# Patient Record
Sex: Female | Born: 1985 | Race: Black or African American | Hispanic: No | Smoking: Never smoker
Health system: Southern US, Community
[De-identification: ages and names within clinical notes are randomized; demographics above are authoritative.]

## PROBLEM LIST (undated history)

## (undated) ENCOUNTER — Inpatient Hospital Stay (HOSPITAL_COMMUNITY): Payer: Self-pay

## (undated) DIAGNOSIS — R12 Heartburn: Secondary | ICD-10-CM

## (undated) DIAGNOSIS — N390 Urinary tract infection, site not specified: Secondary | ICD-10-CM

## (undated) DIAGNOSIS — O26899 Other specified pregnancy related conditions, unspecified trimester: Secondary | ICD-10-CM

## (undated) DIAGNOSIS — B009 Herpesviral infection, unspecified: Secondary | ICD-10-CM

## (undated) DIAGNOSIS — D649 Anemia, unspecified: Secondary | ICD-10-CM

## (undated) DIAGNOSIS — IMO0002 Reserved for concepts with insufficient information to code with codable children: Secondary | ICD-10-CM

## (undated) HISTORY — PX: WISDOM TOOTH EXTRACTION: SHX21

## (undated) HISTORY — PX: TONSILLECTOMY AND ADENOIDECTOMY: SHX28

## (undated) HISTORY — DX: Reserved for concepts with insufficient information to code with codable children: IMO0002

---
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
## 2003-12-31 ENCOUNTER — Ambulatory Visit: Payer: Self-pay | Admitting: Obstetrics and Gynecology

## 2004-09-26 ENCOUNTER — Emergency Department (HOSPITAL_COMMUNITY): Admission: EM | Admit: 2004-09-26 | Discharge: 2004-09-26 | Payer: Self-pay | Admitting: Family Medicine

## 2006-02-09 ENCOUNTER — Emergency Department (HOSPITAL_COMMUNITY): Admission: EM | Admit: 2006-02-09 | Discharge: 2006-02-09 | Payer: Self-pay | Admitting: Emergency Medicine

## 2006-05-29 ENCOUNTER — Emergency Department (HOSPITAL_COMMUNITY): Admission: EM | Admit: 2006-05-29 | Discharge: 2006-05-29 | Payer: Self-pay | Admitting: Emergency Medicine

## 2006-12-25 ENCOUNTER — Inpatient Hospital Stay (HOSPITAL_COMMUNITY): Admission: AD | Admit: 2006-12-25 | Discharge: 2006-12-25 | Payer: Self-pay | Admitting: Obstetrics & Gynecology

## 2008-09-03 ENCOUNTER — Emergency Department (HOSPITAL_COMMUNITY): Admission: EM | Admit: 2008-09-03 | Discharge: 2008-09-03 | Payer: Self-pay | Admitting: Emergency Medicine

## 2008-09-03 ENCOUNTER — Encounter (INDEPENDENT_AMBULATORY_CARE_PROVIDER_SITE_OTHER): Payer: Self-pay | Admitting: *Deleted

## 2009-07-10 ENCOUNTER — Emergency Department (HOSPITAL_COMMUNITY): Admission: EM | Admit: 2009-07-10 | Discharge: 2009-07-10 | Payer: Self-pay | Admitting: Emergency Medicine

## 2009-08-15 ENCOUNTER — Ambulatory Visit: Payer: Self-pay | Admitting: Nurse Practitioner

## 2009-08-15 ENCOUNTER — Inpatient Hospital Stay (HOSPITAL_COMMUNITY): Admission: AD | Admit: 2009-08-15 | Discharge: 2009-08-15 | Payer: Self-pay | Admitting: Family Medicine

## 2009-11-14 ENCOUNTER — Encounter (INDEPENDENT_AMBULATORY_CARE_PROVIDER_SITE_OTHER): Payer: Self-pay | Admitting: *Deleted

## 2009-11-18 ENCOUNTER — Ambulatory Visit: Payer: Self-pay | Admitting: Gastroenterology

## 2009-11-18 DIAGNOSIS — D509 Iron deficiency anemia, unspecified: Secondary | ICD-10-CM

## 2009-11-18 DIAGNOSIS — R1031 Right lower quadrant pain: Secondary | ICD-10-CM

## 2009-11-18 DIAGNOSIS — R197 Diarrhea, unspecified: Secondary | ICD-10-CM

## 2009-11-18 LAB — CONVERTED CEMR LAB
ALT: 12 units/L (ref 0–35)
AST: 16 units/L (ref 0–37)
Albumin: 3.6 g/dL (ref 3.5–5.2)
Alkaline Phosphatase: 58 units/L (ref 39–117)
BUN: 9 mg/dL (ref 6–23)
Basophils Absolute: 0 10*3/uL (ref 0.0–0.1)
Basophils Relative: 0.5 % (ref 0.0–3.0)
Bilirubin, Direct: 0 mg/dL (ref 0.0–0.3)
CO2: 26 meq/L (ref 19–32)
CRP, High Sensitivity: 10.96 — ABNORMAL HIGH (ref 0.00–5.00)
Calcium: 9 mg/dL (ref 8.4–10.5)
Chloride: 103 meq/L (ref 96–112)
Creatinine, Ser: 0.6 mg/dL (ref 0.4–1.2)
Eosinophils Absolute: 0.2 10*3/uL (ref 0.0–0.7)
Eosinophils Relative: 4.2 % (ref 0.0–5.0)
Ferritin: 7.7 ng/mL — ABNORMAL LOW (ref 10.0–291.0)
Folate: 7.2 ng/mL
GFR calc non Af Amer: 143.36 mL/min (ref >60)
Glucose, Bld: 87 mg/dL (ref 70–99)
HCT: 29.3 % — ABNORMAL LOW (ref 36.0–46.0)
Hemoglobin: 9.4 g/dL — ABNORMAL LOW (ref 12.0–15.0)
Iron: 20 ug/dL — ABNORMAL LOW (ref 42–145)
Lymphocytes Relative: 39.6 % (ref 12.0–46.0)
Lymphs Abs: 2 10*3/uL (ref 0.7–4.0)
MCHC: 32.2 g/dL (ref 30.0–36.0)
MCV: 75.3 fL — ABNORMAL LOW (ref 78.0–100.0)
Monocytes Absolute: 0.4 10*3/uL (ref 0.1–1.0)
Monocytes Relative: 7.8 % (ref 3.0–12.0)
Neutro Abs: 2.4 10*3/uL (ref 1.4–7.7)
Neutrophils Relative %: 47.9 % (ref 43.0–77.0)
Platelets: 334 10*3/uL (ref 150.0–400.0)
Potassium: 4.2 meq/L (ref 3.5–5.1)
RBC: 3.89 M/uL (ref 3.87–5.11)
RDW: 18 % — ABNORMAL HIGH (ref 11.5–14.6)
Saturation Ratios: 4.2 % — ABNORMAL LOW (ref 20.0–50.0)
Sed Rate: 35 mm/hr — ABNORMAL HIGH (ref 0–22)
Sodium: 136 meq/L (ref 135–145)
TSH: 1.47 microintl units/mL (ref 0.35–5.50)
Total Bilirubin: 0.2 mg/dL — ABNORMAL LOW (ref 0.3–1.2)
Total Protein: 7.2 g/dL (ref 6.0–8.3)
Transferrin: 338.8 mg/dL (ref 212.0–360.0)
Vitamin B-12: 664 pg/mL (ref 211–911)
WBC: 5.1 10*3/uL (ref 4.5–10.5)
hCG, Beta Chain, Quant, S: <0.5 milliintl units/mL

## 2009-12-02 ENCOUNTER — Encounter (INDEPENDENT_AMBULATORY_CARE_PROVIDER_SITE_OTHER): Payer: Self-pay | Admitting: *Deleted

## 2009-12-26 ENCOUNTER — Encounter (INDEPENDENT_AMBULATORY_CARE_PROVIDER_SITE_OTHER): Payer: Self-pay | Admitting: *Deleted

## 2009-12-29 ENCOUNTER — Ambulatory Visit: Payer: Self-pay | Admitting: Gastroenterology

## 2010-01-11 ENCOUNTER — Encounter: Payer: Self-pay | Admitting: Gastroenterology

## 2010-01-12 ENCOUNTER — Ambulatory Visit: Payer: Self-pay | Admitting: Gastroenterology

## 2010-01-13 ENCOUNTER — Encounter: Payer: Self-pay | Admitting: Gastroenterology

## 2010-02-24 NOTE — Miscellaneous (Signed)
Summary: prep  Clinical Lists Changes  Medications: Added new medication of TRILYTE 420 GM  SOLR (PEG 3350-KCL-NA BICARB-NACL) As per prep instructions. - Signed Rx of TRILYTE 420 GM  SOLR (PEG 3350-KCL-NA BICARB-NACL) As per prep instructions.;  #1 x 0;  Signed;  Entered by: Clide Cliff RN;  Authorized by: Mardella Layman MD [facility];  Method used: Electronically to [facility]. [phone]*, [address], [place], [place], [place]  [address], Ph: [phone], Fax: [phone]    Prescriptions: TRILYTE 420 GM  SOLR (PEG 3350-KCL-NA BICARB-NACL) As per prep instructions.  #1 x 0   Entered by:   Clide Cliff RN   Authorized by:   Mardella Layman MD [facility]   Signed by:   Clide Cliff RN on 12/29/2009   Method used:   Electronically to        [facility]. [phone]* (retail)       [address]       [place], [place]  [address]       Ph: [phone]       Fax: [phone]   RxID:   [id number]

## 2010-02-24 NOTE — Assessment & Plan Note (Signed)
Summary: abd pain...as.   History of Present Illness Visit Type: Initial Visit Primary GI MD: Sheryn Bison MD FACP FAGA Primary Provider: n/a Requesting Provider: n/a Chief Complaint: Pt c/o lower abdominal pain, has been to the ER for abdominal pain 3 months ago at [facility] History of Present Illness:   25 year old African American female referred for evaluation of 8 months of crampy right lower quadrant pain, diarrhea, intermittent rectal bleeding, and abnormal CT scan in August suggesting ileitis.  Miranda Tanner has been in excellent health without medical problems. She has had previous cesarean section. The last year she's had intermittent crampy lower abdominal pain, diarrhea, occasional rectal bleeding, has been seen in the emergency room twice and treated inferiorly with Cipro and Flagyl. She relates this has given her some periodic relief but her symptoms returned. CT Scan on August 10 showed thickening of the terminal ileum with adenopathy in the right lower quadrant. Exam otherwise was unremarkable. Her health care has been complicated by no insurance.  She denies associated skin rashes, joint pains, oral stomatitis, or visual difficulties. She has a regular periods with her last menstrual period allegedly on October 2. She denies a specific food intolerances, upper GI or hepatobiliary complaints or history of hepatitis or pancreatitis. 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 In the past she has had reactions to penicillin. Family history is noncontributory.   GI Review of Systems    Reports abdominal pain.     Location of  Abdominal pain: lower abdomen.    Denies acid reflux, belching, bloating, chest pain, dysphagia with liquids, dysphagia with solids, heartburn, loss of appetite, nausea, vomiting, vomiting blood, weight loss, and  weight gain.      Reports change in bowel habits,  hemorrhoids, and  rectal bleeding.     Denies anal fissure, black tarry stools, constipation, diarrhea, diverticulosis, fecal incontinence, heme positive stool, irritable bowel syndrome, jaundice, light color stool, liver problems, and  rectal pain. Preventive Screening-Counseling & Management  Alcohol-Tobacco     Smoking Status: quit      Drug Use:  no.      Current Medications (verified): 1)  None  Allergies (verified): 1)  ! Claritin 2)  ! Pcn  Past History:  Past medical, surgical, family and social histories (including risk factors) reviewed for relevance to current acute and chronic problems.  Past Medical History: Unremarkable  Past Surgical History: C-section 2004  Family History: Reviewed history and no changes required. Family History of Diabetes:  No FH of Colon Cancer:  Social History: Reviewed history and no changes required. single 1 child Patient is a former smoker.  Alcohol Use - yes  wine Illicit Drug Use - no Smoking Status:  quit Drug Use:  no  Review of Systems       The patient complains of menstrual pain.  The patient denies allergy/sinus, anemia, anxiety-new, arthritis/joint pain, back pain, blood in urine, breast changes/lumps, change in vision, confusion, cough, coughing up blood, depression-new, fainting, fatigue, fever, headaches-new, hearing problems, heart murmur, heart rhythm changes, itching, muscle pains/cramps, night sweats, nosebleeds, pregnancy symptoms, shortness of breath, skin rash, sleeping problems, sore throat, swelling of feet/legs, swollen lymph glands, thirst - excessive , urination - excessive , urination changes/pain, urine leakage, vision changes, and voice change.    Vital Signs:  Patient profile:   25 year old female Height:      60  inches Weight:      138 pounds BMI:     26.17 BSA:     1.61 Pulse rate:   80 / minute Pulse rhythm:   regular BP sitting:   110 / 80  (left arm)  Vitals Entered By: Merri Ray  CMA Duncan Dull) (November 18, 2009 10:13 AM)  Physical Exam  General:  Well developed, well nourished, no acute distress.healthy appearing.   Head:  Normocephalic and atraumatic. Eyes:  PERRLA, no icterus.exam deferred to patient's ophthalmologist.   Neck:  Supple; no masses or thyromegaly. Lungs:  Clear throughout to auscultation. Heart:  Regular rate and rhythm; no murmurs, rubs,  or bruits. Abdomen:  Soft, nontender and nondistended. No masses, hepatosplenomegaly or hernias noted. Normal bowel sounds.Tenderness to deep palpation in the right lower quadrant without any mass noted. Rectal:  Normal exam.hemocult negative.  No evidence of perirectal disease, fissures, or fistulae. Extremities:  No clubbing, cyanosis, edema or deformities noted. Neurologic:  Alert and  oriented x4;  grossly normal neurologically. Skin:  Intact without significant lesions or rashes. Cervical Nodes:  No significant cervical adenopathy. Psych:  Alert and cooperative. Normal mood and affect.   Impression & Recommendations:  Problem # 1:  RLQ PAIN (ICD-789.03) Assessment Unchanged Probable Crohn's ileitis perceptible pathology and abnormal CT scan. Labs have been ordered as has colonoscopy, and because of the lack of funds for medications and no samples of Entocort, I have started Pentasa 3 pills twice a day as tolerated. She had been advised to seek help from [facility] for her medical ongoing care and help with her medications expands..pregnancy test also ordered. Orders: TLB-CBC Platelet - w/Differential (85025-CBCD) TLB-BMP (Basic Metabolic Panel-BMET) (80048-METABOL) TLB-Hepatic/Liver Function Pnl (80076-HEPATIC) TLB-TSH (Thyroid Stimulating Hormone) (84443-TSH) TLB-B12, Serum-Total ONLY ([address]-U04) TLB-Ferritin (82728-FER) TLB-Folic Acid (Folate) (82746-FOL) TLB-IBC Pnl (Iron/FE;Transferrin) (83550-IBC) TLB-CRP-High Sensitivity (C-Reactive Protein) (86140-FCRP) TLB-Preg Serum Quant (B-hCG)  (84702-HCG-QN) TLB-Sedimentation Rate (ESR) (85652-ESR) Colonoscopy (Colon)  Problem # 2:  DIARRHEA (ICD-787.91) Assessment: Unchanged  Orders: TLB-CBC Platelet - w/Differential (85025-CBCD) TLB-BMP (Basic Metabolic Panel-BMET) (80048-METABOL) TLB-Hepatic/Liver Function Pnl (80076-HEPATIC) TLB-TSH (Thyroid Stimulating Hormone) (84443-TSH) TLB-B12, Serum-Total ONLY (54098-J19) TLB-Ferritin (82728-FER) TLB-Folic Acid (Folate) (82746-FOL) TLB-IBC Pnl (Iron/FE;Transferrin) (83550-IBC) TLB-CRP-High Sensitivity (C-Reactive Protein) (86140-FCRP) TLB-Preg Serum Quant (B-hCG) (84702-HCG-QN) TLB-Sedimentation Rate (ESR) (85652-ESR) Colonoscopy (Colon)  Patient Instructions: 1)  Take the samples of Pentasa as directed on your patient instructions. 2)  When you hear back about the patient assistance call back to schedule your colonoscopy. 3)  Please go to the basement today for your labs.  4)  The medication list was reviewed and reconciled.  All changed / newly prescribed medications were explained.  A complete medication list was provided to the patient / caregiver.

## 2010-02-24 NOTE — Letter (Signed)
Summary: New Patient letter  [facility]  [address] [place], [place] [address]   Phone: [phone]  Fax: [phone]       11/14/2009 MRN: [id number]  Miranda Tanner [address] [place], [place]  [address]  Dear Miranda Tanner,  Welcome to the Gastroenterology Division at [facility].    You are scheduled to see Dr. Jarold Motto on 11/18/2009 at 10:00AM on the 3rd floor at [facility], [address]  We ask that you try to arrive at our office 15 minutes prior to your appointment time to allow for check-in.  We would like you to complete the enclosed self-administered evaluation form prior to your visit and bring it with you on the day of your appointment.  We will review it with you.  Also, please bring a complete list of all your medications or, if you prefer, bring the medication bottles and we will list them.  Please bring your insurance card so that we may make a copy of it.  If your insurance requires a referral to see a specialist, please bring your referral form from your primary care physician.  Co-payments are due at the time of your visit and may be paid by cash, check or credit card.     Your office visit will consist of a consult with your physician (includes a physical exam), any laboratory testing he/she may order, scheduling of any necessary diagnostic testing (e.g. x-ray, ultrasound, CT-scan), and scheduling of a procedure (e.g. Endoscopy, Colonoscopy) if required.  Please allow enough time on your schedule to allow for any/all of these possibilities.    If you cannot keep your appointment, please call [phone] to cancel or reschedule prior to your appointment date.  This allows [name] the opportunity to schedule an appointment for another patient in need of care.  If you do not cancel or reschedule by 5 p.m. the business day prior to your appointment date, you will be charged a $50.00 late cancellation/no-show fee.    Thank you for choosing  [facility] for your medical needs.  We appreciate the opportunity to care for you.  Please visit [name] at our website  to learn more about our practice.                     Sincerely,                                                             The Gastroenterology Division

## 2010-02-24 NOTE — Letter (Signed)
Summary: [facility] Instructions  [facility]  [address] [place], [place] [address]   Phone: [phone]  Fax: [phone]       Miranda Tanner    25-Jun-1985    MRN: [id number]        Procedure Day Dorna Bloom:  Duanne Limerick  01/12/10     Arrival Time:  10:30AM     Procedure Time:  11:30AM     Location of Procedure:                    _X _  [facility] (4th Floor)                      PREPARATION FOR COLONOSCOPY WITH MOVIPREP   Starting 5 days prior to your procedure 01/07/10 do not eat nuts, seeds, popcorn, corn, beans, peas,  salads, or any raw vegetables.  Do not take any fiber supplements (e.g. Metamucil, Citrucel, and Benefiber).  THE DAY BEFORE YOUR PROCEDURE         DATE: 01/11/10  DAY: SUNDAY  1.  Drink clear liquids the entire day-NO SOLID FOOD  2.  Do not drink anything colored red or purple.  Avoid juices with pulp.  No orange juice.  3.  Drink at least 64 oz. (8 glasses) of fluid/clear liquids during the day to prevent dehydration and help the prep work efficiently.  CLEAR LIQUIDS INCLUDE: Water Jello Ice Popsicles Tea (sugar ok, no milk/cream) Powdered fruit flavored drinks Coffee (sugar ok, no milk/cream) Gatorade Juice: apple, white grape, white cranberry  Lemonade Clear bullion, consomm, broth Carbonated beverages (any kind) Strained chicken noodle soup Hard Candy                             4.  In the morning, mix first dose of MoviPrep solution:    Empty 1 Pouch A and 1 Pouch B into the disposable container    Add lukewarm drinking water to the top line of the container. Mix to dissolve    Refrigerate (mixed solution should be used within 24 hrs)  5.  Begin drinking the prep at 5:00 p.m. The MoviPrep container is divided by 4 marks.   Every 15 minutes drink the solution down to the next mark (approximately 8 oz) until the full liter is complete.   6.  Follow completed prep with 16 oz of clear liquid of your choice (Nothing  red or purple).  Continue to drink clear liquids until bedtime.  7.  Before going to bed, mix second dose of MoviPrep solution:    Empty 1 Pouch A and 1 Pouch B into the disposable container    Add lukewarm drinking water to the top line of the container. Mix to dissolve    Refrigerate  THE DAY OF YOUR PROCEDURE      DATE: 01/12/10  DAY: MONDAY  Beginning at 6:30AM (5 hours before procedure):         1. Every 15 minutes, drink the solution down to the next mark (approx 8 oz) until the full liter is complete.  2. Follow completed prep with 16 oz. of clear liquid of your choice.    3. You may drink clear liquids until 9:30AM (2 HOURS BEFORE PROCEDURE).   MEDICATION INSTRUCTIONS  Unless otherwise instructed, you should take regular prescription medications with a small sip of water   as early as possible the morning of your  procedure.  Diabetic patients - see separate instructions.  Stop taking Plavix or Aggrenox on  _  _  (7 days before procedure).     Stop taking Coumadin on  _ _  (5 days before procedure).  Additional medication instructions: _         OTHER INSTRUCTIONS  You will need a responsible adult at least 27 years of age to accompany you and drive you home.   This person must remain in the waiting room during your procedure.  Wear loose fitting clothing that is easily removed.  Leave jewelry and other valuables at home.  However, you may wish to bring a book to read or  an iPod/MP3 player to listen to music as you wait for your procedure to start.  Remove all body piercing jewelry and leave at home.  Total time from sign-in until discharge is approximately 2-3 hours.  You should go home directly after your procedure and rest.  You can resume normal activities the  day after your procedure.  The day of your procedure you should not:   Drive   Make legal decisions   Operate machinery   Drink alcohol   Return to work  You will receive specific  instructions about eating, activities and medications before you leave.  Patient was given tryilyte due to financial situation.  The above instructions have been reviewed and explained to me by   Clide Cliff, RN_____________________    I fully understand and can verbalize these instructions _____________________________ Date _________

## 2010-02-24 NOTE — Letter (Signed)
Summary: Pre Visit Letter Revised  [facility]  [address] [place], [place] [address]   Phone: [phone]  Fax: [phone]        12/02/2009 MRN: [id number] Miranda Tanner [address] [place], [place]  [address]             Procedure Date:  01-12-10   Welcome to the Gastroenterology Division at [facility].    You are scheduled to see a nurse for your pre-procedure visit on 12-29-09 at 11:00a.m. on the 3rd floor at [facility], [address].  We ask that you try to arrive at our office 15 minutes prior to your appointment time to allow for check-in.  Please take a minute to review the attached form.  If you answer "Yes" to one or more of the questions on the first page, we ask that you call the person listed at your earliest opportunity.  If you answer "No" to all of the questions, please complete the rest of the form and bring it to your appointment.    Your nurse visit will consist of discussing your medical and surgical history, your immediate family medical history, and your medications.   If you are unable to list all of your medications on the form, please bring the medication bottles to your appointment and we will list them.  We will need to be aware of both prescribed and over the counter drugs.  We will need to know exact dosage information as well.    Please be prepared to read and sign documents such as consent forms, a financial agreement, and acknowledgement forms.  If necessary, and with your consent, a friend or relative is welcome to sit-in on the nurse visit with you.  Please bring your insurance card so that we may make a copy of it.  If your insurance requires a referral to see a specialist, please bring your referral form from your primary care physician.  No co-pay is required for this nurse visit.     If you cannot keep your appointment, please call [phone] to cancel or reschedule prior to your appointment date.  This allows  [name] the opportunity to schedule an appointment for another patient in need of care.    Thank you for choosing [facility] for your medical needs.  We appreciate the opportunity to care for you.  Please visit [name] at our website  to learn more about our practice.  Sincerely, The Gastroenterology Division

## 2010-02-24 NOTE — Miscellaneous (Signed)
Summary: direct colon--ch.  Clinical Lists Changes  Allergies: Changed allergy or adverse reaction from CLARITIN to [facility] Removed allergy or adverse reaction of PCN Added new allergy or adverse reaction of PCN

## 2010-02-26 NOTE — Miscellaneous (Signed)
Summary: needs reglan called in for prep  Clinical Lists Changes  she was told to take reglan during her prep, but none was called in.    Medications: Added new medication of REGLAN 10 MG  TABS (METOCLOPRAMIDE HCL) take as directed - Signed Rx of REGLAN 10 MG  TABS (METOCLOPRAMIDE HCL) take as directed;  #2 x 0;  Signed;  Entered by: Rachael Fee MD;  Authorized by: Rachael Fee MD;  Method used: Electronically to [facility]. [phone]*, [address] [place], [place], [place]  [address], Ph: [phone], Fax: [phone]  s  Prescriptions: REGLAN 10 MG  TABS (METOCLOPRAMIDE HCL) take as directed  #2 x 0   Entered and Authorized by:   Rachael Fee MD   Signed by:   Rachael Fee MD on 01/11/2010   Method used:   Electronically to        [facility]. [phone]* (retail)       [address]       [place], [place]  [address]       Ph: [phone]       Fax: [phone]   RxID:   [id number]

## 2010-02-26 NOTE — Letter (Signed)
Summary: Patient Notice- Colon Biospy Results  [facility]  [address] [place], [place] [address]   Phone: [phone]  Fax: [phone]        January 13, 2010 MRN: [id number]    Miranda Tanner [address] [place], [place]  [address]    Dear Ms. SMITH,  I am pleased to inform you that the biopsies taken during your recent colonoscopy did not show any evidence of cancer upon pathologic examination.  Additional information/recommendations:  __No further action is needed at this time.  Please follow-up with      your primary care physician for your other healthcare needs.  __Please call [phone] to schedule a return visit to review      your condition.  _x_Continue with the treatment plan as outlined on the day of your      exam.  __You should have a repeat colonoscopy examination for this problem           in _ years.  Please call us if you are having persistent problems or have questions about your condition that have not been fully answered at this time.  Sincerely,  Mardella Layman MD [facility]   This letter has been electronically signed by your physician.  Appended Document: Patient Notice- Colon Biospy Results Letter mailed

## 2010-02-26 NOTE — Procedures (Signed)
Summary: Colonoscopy  Patient: [name] Note: All result statuses are Final unless otherwise noted.  Tests: (1) Colonoscopy (COL)   COL Colonoscopy           DONE     [facility]     [address]     [place], [place]  [address]           COLONOSCOPY PROCEDURE REPORT           PATIENT:  Miranda Tanner, Miranda Tanner  MR#:  [id number]     BIRTHDATE:  April 05, 1985, 24 yrs. old  GENDER:  female     ENDOSCOPIST:  Vania Rea. Jarold Motto, MD, [facility]     REF. BY:     PROCEDURE DATE:  01/12/2010     PROCEDURE:  Colonoscopy with biopsy     ASA CLASS:  Class II     INDICATIONS:  Abdominal pain ?? CROHN'S ILEITIS.     MEDICATIONS:   Fentanyl 50 mcg IV, Versed 8 mg IV           DESCRIPTION OF PROCEDURE:   After the risks benefits and     alternatives of the procedure were thoroughly explained, informed     consent was obtained.  Digital rectal exam was performed and     revealed no abnormalities.   The [id number] endoscope was     introduced through the anus and advanced to the terminal ileum     which was intubated for a short distance, limited by poor     preparation.    The quality of the prep was poor, using Nulytley.     The instrument was then slowly withdrawn as the colon was fully     examined.<<PROCEDUREIMAGES>>           FINDINGS:  The terminal ileum appeared normal. BIOPSIES DONE.  No     polyps or cancers were seen.  Otherwise normal colonoscopy without     other polyps, masses, vascular ectasias, or inflammatory changes.     Retroflexed views in the rectum revealed no abnormalities.    The     scope was then withdrawn from the patient and the procedure     completed.           COMPLICATIONS:  None     ENDOSCOPIC IMPRESSION:     1) Normal terminal ileum     2) No polyps or cancers     3) Otherwise nl colonoscopy WMO     RECOMMENDATIONS:     F/U PRN.     REPEAT EXAM:  No           ______________________________     Vania Rea. Jarold Motto, MD, Clementeen Graham           CC:           n.     eSIGNED:   Vania Rea. Patterson at 01/12/2010 11:43 AM           Trudi Ida, [id number]  Note: An exclamation mark (!) indicates a result that was not dispersed into the flowsheet. Document Creation Date: 01/12/2010 11:43 AM _______________________________________________________________________  (1) Order result status: Final Collection or observation date-time: 01/12/2010 11:37 Requested date-time:  Receipt date-time:  Reported date-time:  Referring Physician:   Ordering Physician: Sheryn Bison [phone]) Specimen Source:  Source: Launa Grill Order Number: [id number] Lab site:

## 2010-04-11 LAB — URINALYSIS, ROUTINE W REFLEX MICROSCOPIC
Bilirubin Urine: NEGATIVE
Glucose, UA: NEGATIVE mg/dL
Hgb urine dipstick: NEGATIVE
Ketones, ur: NEGATIVE mg/dL
pH: 7 (ref 5.0–8.0)

## 2010-04-12 LAB — URINALYSIS, ROUTINE W REFLEX MICROSCOPIC
Glucose, UA: NEGATIVE mg/dL
Hgb urine dipstick: NEGATIVE
Specific Gravity, Urine: 1.006 (ref 1.005–1.030)
Urobilinogen, UA: 0.2 mg/dL (ref 0.0–1.0)

## 2010-04-12 LAB — WET PREP, GENITAL
Clue Cells Wet Prep HPF POC: NONE SEEN
Trich, Wet Prep: NONE SEEN
Yeast Wet Prep HPF POC: NONE SEEN

## 2010-04-12 LAB — POCT PREGNANCY, URINE: Preg Test, Ur: NEGATIVE

## 2010-05-02 LAB — URINALYSIS, ROUTINE W REFLEX MICROSCOPIC
Glucose, UA: NEGATIVE mg/dL
Hgb urine dipstick: NEGATIVE
Specific Gravity, Urine: 1.03 (ref 1.005–1.030)

## 2010-05-02 LAB — COMPREHENSIVE METABOLIC PANEL
ALT: 18 U/L (ref 0–35)
AST: 28 U/L (ref 0–37)
Albumin: 3.4 g/dL — ABNORMAL LOW (ref 3.5–5.2)
Chloride: 105 mEq/L (ref 96–112)
Creatinine, Ser: 0.74 mg/dL (ref 0.4–1.2)
GFR calc Af Amer: >60 mL/min (ref >60)
Sodium: 137 mEq/L (ref 135–145)
Total Bilirubin: 0.8 mg/dL (ref 0.3–1.2)

## 2010-05-02 LAB — CBC
MCV: 84.9 fL (ref 78.0–100.0)
Platelets: 267 10*3/uL (ref 150–400)
WBC: 7.5 10*3/uL (ref 4.0–10.5)

## 2010-05-02 LAB — DIFFERENTIAL
Basophils Absolute: 0 10*3/uL (ref 0.0–0.1)
Eosinophils Relative: 0 % (ref 0–5)
Lymphocytes Relative: 11 % — ABNORMAL LOW (ref 12–46)
Lymphs Abs: 0.8 10*3/uL (ref 0.7–4.0)
Monocytes Absolute: 0.7 10*3/uL (ref 0.1–1.0)

## 2010-06-12 NOTE — Discharge Summary (Signed)
NAME:  Miranda Tanner, Miranda Tanner                          ACCOUNT NO.:  [id number]   MEDICAL RECORD NO.:  [id number]                   PATIENT TYPE:  INP   LOCATION:  9113                                 FACILITY:  [facility]   PHYSICIAN:  Roseanna Rainbow, M.D.         DATE OF BIRTH:  [birth year]   DATE OF ADMISSION:  01/26/2002  DATE OF DISCHARGE:  01/30/2002                                 DISCHARGE SUMMARY   CHIEF COMPLAINT:  The patient is a 25 year old gravida 1 with an estimated  date of confinement of January 23, 2002, who presents complaining of  contractions.   PAST OBSTETRICAL AND GYNECOLOGICAL HISTORY:  As above.   PAST MEDICAL HISTORY:  She denies.   PAST SURGICAL HISTORY:  She denies.   SOCIAL HISTORY:  She denies any tobacco, ethanol, or substance abuse.   PHYSICAL EXAMINATION:  VITAL SIGNS:  Blood pressure 140/90.  HEENT:  Normocephalic and atraumatic.  LUNGS:  Clear to auscultation bilaterally.  HEART:  Regular rate and rhythm without any murmurs, rubs, or gallops.  ABDOMEN:  The estimated fetal weight was 5 pounds 12 ounces.  PELVIC:  The cervix was 2 cm and 70% effaced with the vertex at a -2 to -3  station.  EXTREMITIES:  Nontender.   ASSESSMENT:  Primigravida at term in early labor with borderline elevated  blood pressures.   PLAN:  Admission.  Would check PIH labs.   HOSPITAL COURSE:  The patient was subsequently admitted.  She was augmented  with Pitocin.  She progressed to 4 cm dilatation and 60% effaced.  The  cervix was felt to be edematous.  The vertex was at a -3 station.  [place] units were approximately 200.  She became febrile with a  temperature maximum of 100.8 degrees.  The fetus became slightly tachycardic  with the baseline in the 160 silk.  She was brought for a cesarean delivery.  Please see the dictated operative summary for further details.  The  postoperative course was remarkable for anemia with a hemoglobin of 8.2 and  this remained  stable on postoperative day #2 with a hemoglobin of 7.9.   DISPOSITION:  She was discharged to home on postoperative day #3, tolerating  a regular diet.   DISCHARGE DIAGNOSES:  1. Term intrauterine pregnancy.  2. Mild pregnancy-induced hypertension.  3. Protracted latent phase with chorioamnionitis.   PROCEDURES:  Primary cesarean delivery.   CONDITION ON DISCHARGE:  Stable.   DIET:  Regular.   ACTIVITY:  Per instruction booklet.   DISCHARGE MEDICATIONS:  1. Percocet one to two tablets every four to six hours as needed.  2. Ibuprofen one tablet every six hours as needed.  3. Ferrous sulfate one tablet t.i.d.  4. Colace as needed.  5. Ortho Tri-Cyclen Low one tablet p.o. daily.  Start second Sunday.  6. Prenatal vitamins one tablet p.o. daily.   FOLLOW-UP:  The patient was to follow up in  the office in one week.                                               Roseanna Rainbow, M.D.    Judee Clara  D:  03/08/2002  T:  03/08/2002  Job:  [id number]

## 2010-06-12 NOTE — Op Note (Signed)
NAME:  Miranda Tanner, Miranda Tanner                          ACCOUNT NO.:  [id number]   MEDICAL RECORD NO.:  [id number]                   PATIENT TYPE:  INP   LOCATION:  9113                                 FACILITY:  [facility]   PHYSICIAN:  Roseanna Rainbow, M.D.         DATE OF BIRTH:  1985-11-19   DATE OF PROCEDURE:  01/27/2002  DATE OF DISCHARGE:  01/30/2002                                 OPERATIVE REPORT   PREOPERATIVE DIAGNOSES:  1. Intrauterine pregnancy at term.  2. Mild pregnancy induced hypertension.  3. Protracted latent phase.  4. Chorioamnionitis.   POSTOPERATIVE DIAGNOSES:  1. Intrauterine pregnancy at term.  2. Mild pregnancy induced hypertension.  3. Protracted latent phase.  4. Chorioamnionitis.   PROCEDURE:  Primary low uterine flap elliptical cesarean delivery via  Pfannenstiel skin incision.   SURGEON:  Roseanna Rainbow, M.D.   ANESTHESIA:  Epidural.   ESTIMATED BLOOD LOSS:  800 mL.   COMPLICATIONS:  None.   INDICATIONS:  The patient is a 25 year old gravida 1 who presented in early  labor.  Maximum dilatation 3 cm, 80% effaced, with the vertex at a -3.  She  developed chorioamnionitis during labor.   PROCEDURE:  The patient was taken to the operating room with an epidural  anesthetic.  The epidural anesthetic was found to be adequate.  She was  prepped and draped in the usual sterile fashion.  A Pfannenstiel skin  incision was then made with a scalpel and carried down to the fascia with  the Bovie.  The fascia was nicked in the midline and the fascial incision  was extended bilaterally with the curved Mayo scissors.  The superior aspect  of the fascial incision was then grasped with Kocher clamps, tented up, then  the underlying rectus muscles dissected off.  The inferior aspect of the  fascial incision was manipulated in a similar fashion.  The rectus muscles  were separated in the midline.  The parietoperitoneum was tented up and  entered sharply.  This  incision was extended superiorly and inferiorly with  good visualization of the bladder.  The bladder blade was then placed.  The  vesicouterine peritoneum was tented up and entered sharply.  This incision  was then extended bilaterally and the bladder flap created digitally.  The  lower uterine segment was then incised in a transverse fashion and the  incision extended bluntly.  A live boy female vertex LOP was delivered  atraumatically.  The infant was suctioned with bulb suction.  The cord was  clamped and cut and the infant was handed to the awaiting neonatologists.  Apgars were 8 at one minute and 9 at five minutes, respectively.  The  placenta was delivered.  The uterus was evacuated of amniotic fluid, clots,  and debris with a moistened laparotomy sponge.  The uterine incision was  repaired in a running interlocking fashion using 0 Monocryl.  A second layer  was  used to imbricate the middle aspect of the incision.  Excellent  hemostasis was noted.  The pericolic gutters were then copiously irrigated.  The fascia was  reapproximated with 0 Vicryl.  The skin was reapproximated with staples.  At  the close of the procedure the instrument and pack counts were said to be  correct x2.  The patient was taken to the PACU awake and in stable  condition.                                               Roseanna Rainbow, M.D.    Judee Clara  D:  03/08/2002  T:  03/08/2002  Job:  [id number]

## 2010-11-03 LAB — CBC
HCT: 34.1 — ABNORMAL LOW
MCV: 86.9
Platelets: 300
RDW: 12.6
WBC: 8.1

## 2010-11-03 LAB — URINE MICROSCOPIC-ADD ON

## 2010-11-03 LAB — URINE CULTURE: Culture: NO GROWTH

## 2010-11-03 LAB — DIFFERENTIAL
Eosinophils Absolute: 0 — ABNORMAL LOW
Eosinophils Relative: 0
Lymphs Abs: 1.2
Monocytes Absolute: 0.8

## 2010-11-03 LAB — URINALYSIS, ROUTINE W REFLEX MICROSCOPIC
Glucose, UA: NEGATIVE
Leukocytes, UA: NEGATIVE
pH: 6

## 2010-11-03 LAB — WET PREP, GENITAL
Clue Cells Wet Prep HPF POC: NONE SEEN
Trich, Wet Prep: NONE SEEN

## 2010-12-31 ENCOUNTER — Inpatient Hospital Stay (HOSPITAL_COMMUNITY)
Admission: AD | Admit: 2010-12-31 | Discharge: 2010-12-31 | Disposition: A | Discharge status: Home / Self Care | Payer: Self-pay | Source: Ambulatory Visit | Attending: Obstetrics & Gynecology | Admitting: Obstetrics & Gynecology

## 2010-12-31 ENCOUNTER — Encounter (HOSPITAL_COMMUNITY): Payer: Self-pay

## 2010-12-31 DIAGNOSIS — R109 Unspecified abdominal pain: Secondary | ICD-10-CM | POA: Insufficient documentation

## 2010-12-31 DIAGNOSIS — N390 Urinary tract infection, site not specified: Secondary | ICD-10-CM

## 2010-12-31 DIAGNOSIS — N3000 Acute cystitis without hematuria: Secondary | ICD-10-CM | POA: Insufficient documentation

## 2010-12-31 HISTORY — DX: Urinary tract infection, site not specified: N39.0

## 2010-12-31 HISTORY — DX: Anemia, unspecified: D64.9

## 2010-12-31 LAB — URINALYSIS, ROUTINE W REFLEX MICROSCOPIC
Bilirubin Urine: NEGATIVE
Glucose, UA: 100 mg/dL — AB
Hgb urine dipstick: NEGATIVE
Ketones, ur: NEGATIVE mg/dL
Leukocytes, UA: NEGATIVE
Protein, ur: 30 mg/dL — AB

## 2010-12-31 LAB — URINE MICROSCOPIC-ADD ON

## 2010-12-31 MED ORDER — PHENAZOPYRIDINE HCL 100 MG PO TABS: 100.0000 mg | ORAL_TABLET | Freq: Three times a day (TID) | ORAL | Status: AC | PRN

## 2010-12-31 MED ORDER — NITROFURANTOIN MONOHYD MACRO 100 MG PO CAPS: 100.0000 mg | ORAL_CAPSULE | Freq: Two times a day (BID) | ORAL | Status: AC

## 2010-12-31 NOTE — ED Provider Notes (Signed)
History     Chief Complaint  Patient presents with  . Abdominal Pain   HPIBrandi TIANAH Tanner is 25 y.o.  presenting with urinary frequency and lower abdominal pain for 4 days.  Took over the counter AZO without relief of sxs.      No past medical history on file.  No past surgical history on file.  No family history on file.  History  Substance Use Topics  . Smoking status: Not on file  . Smokeless tobacco: Not on file  . Alcohol Use: Not on file    Allergies:  Allergies  Allergen Reactions  . Loratadine     REACTION: hives  . Penicillins     REACTION: tongue swelled    No prescriptions prior to admission    Review of Systems  Constitutional: Negative for fever and chills.  Gastrointestinal: Negative for nausea and vomiting.  Genitourinary: Positive for urgency and frequency. Negative for hematuria.   Physical Exam   Blood pressure 129/75, pulse 82, temperature 98.8 F (37.1 C), temperature source Oral, resp. rate 16, height 5\' 2"  (1.575 m), weight 144 lb 12.8 oz (65.681 kg), last menstrual period 12/11/2010, SpO2 97.00%.  Physical Exam  Constitutional: She is oriented to person, place, and time. She appears well-developed and well-nourished. No distress.  HENT:  Head: Normocephalic.  Neck: Normal range of motion.  Respiratory: Effort normal.  Musculoskeletal:       Neg for CVA tenderness bilaterally  Neurological: She is alert and oriented to person, place, and time.  Skin: Skin is warm and dry.    MAU Course  Procedures  MDM   Assessment and Plan  A; Acute cystitis  P: Rx for Macrobid with instruction given to patient.     Increase po fluids.  KEY,EVE M 12/31/2010, 5:28 PM   Matt Holmes, NP 12/31/10 1813

## 2010-12-31 NOTE — Progress Notes (Signed)
Patient states she has had lower abdominal pain and urinary frequency for 4 days. Tried OTC Azo but did not help.

## 2011-01-05 NOTE — ED Provider Notes (Signed)
Agree with note. 

## 2011-05-23 ENCOUNTER — Encounter (HOSPITAL_COMMUNITY): Payer: Self-pay | Admitting: *Deleted

## 2011-05-23 ENCOUNTER — Inpatient Hospital Stay (HOSPITAL_COMMUNITY): Payer: Medicaid Other

## 2011-05-23 ENCOUNTER — Inpatient Hospital Stay (HOSPITAL_COMMUNITY)
Admission: AD | Admit: 2011-05-23 | Discharge: 2011-05-23 | Disposition: A | Discharge status: Home / Self Care | Payer: Medicaid Other | Source: Ambulatory Visit | Attending: Obstetrics & Gynecology | Admitting: Obstetrics & Gynecology

## 2011-05-23 DIAGNOSIS — D509 Iron deficiency anemia, unspecified: Secondary | ICD-10-CM

## 2011-05-23 DIAGNOSIS — D649 Anemia, unspecified: Secondary | ICD-10-CM | POA: Insufficient documentation

## 2011-05-23 DIAGNOSIS — Z331 Pregnant state, incidental: Secondary | ICD-10-CM

## 2011-05-23 DIAGNOSIS — O99019 Anemia complicating pregnancy, unspecified trimester: Secondary | ICD-10-CM | POA: Insufficient documentation

## 2011-05-23 LAB — POCT PREGNANCY, URINE: Preg Test, Ur: POSITIVE — AB

## 2011-05-23 LAB — CBC
HCT: 32.5 % — ABNORMAL LOW (ref 36.0–46.0)
Hemoglobin: 10.8 g/dL — ABNORMAL LOW (ref 12.0–15.0)
MCV: 82.1 fL (ref 78.0–100.0)
RBC: 3.96 MIL/uL (ref 3.87–5.11)
WBC: 5.6 10*3/uL (ref 4.0–10.5)

## 2011-05-23 LAB — DIFFERENTIAL
Eosinophils Relative: 4 % (ref 0–5)
Lymphocytes Relative: 35 % (ref 12–46)
Lymphs Abs: 1.9 10*3/uL (ref 0.7–4.0)
Monocytes Absolute: 0.5 10*3/uL (ref 0.1–1.0)
Monocytes Relative: 8 % (ref 3–12)

## 2011-05-23 LAB — URINALYSIS, ROUTINE W REFLEX MICROSCOPIC
Leukocytes, UA: NEGATIVE
Nitrite: NEGATIVE
Specific Gravity, Urine: 1.02 (ref 1.005–1.030)
pH: 6 (ref 5.0–8.0)

## 2011-05-23 LAB — HCG, QUANTITATIVE, PREGNANCY: hCG, Beta Chain, Quant, S: 15105 m[IU]/mL — ABNORMAL HIGH (ref <5)

## 2011-05-23 MED ORDER — PRENATAL VITAMINS (DIS) PO TABS: 1.0000 | ORAL_TABLET | Freq: Every day | ORAL | Status: DC

## 2011-05-23 NOTE — MAU Note (Signed)
H. Neese, NP at bedside.  Assessment done and poc discussed with pt.  

## 2011-05-23 NOTE — Discharge Instructions (Signed)
________________________________________     To schedule your Maternity Eligibility Appointment, please call [phone].  When you arrive for your appointment you must bring the following items or information listed below.  Your appointment will be rescheduled if you do not have these items or are 15 minutes late. If currently receiving Medicaid, you MUST bring: 1. Medicaid Card 2. Social Security Card 3. Picture ID 4. Proof of Pregnancy 5. Verification of current address if the address on Medicaid card is incorrect "postmarked mail" If not receiving Medicaid, you MUST bring: 1. Social Security Card 2. Picture ID 3. Birth Certificate (if available) Passport or *Green Card 4. Proof of Pregnancy 5. Verification of current address "postmarked mail" for each income presented. 6. Verification of insurance coverage, if any 7. Check stubs from each employer for the previous month (if unable to present check stub  for each week, we will accept check stub for the first and last week ill the same month.) If you can't locate check stubs, you must bring a letter from the employer(s) and it must have the following information on letterhead, typed, in English: o name of company o company telephone number o how long been with the company, if less than one month o how much person earns per hour o how many hours per week work o the gross pay the person earned for the previous month If you are 28 years old or less, you do not have to bring proof of income unless you work or live with the father of the baby and at that time we will need proof of income from you and/or the father of the baby. Green Card recipients are eligible for Medicaid for Pregnant Women (MPW)     Folic Acid in Pregnancy Folic acid is a B vitamin that helps prevent neural tube defects (NTDs). The neural tube is the part of a developing baby that becomes the brain and spinal cord. When the neural tube does not close properly,  a baby is born with an NTD. NTDs include spina bifida, hernia of the spinal cord, and the absence of part of, or all of the brain (anencephaly).  Take folic acid at least 4 weeks before getting pregnant and through the first 3 months of pregnancy. This is when the neural tube is developing. It is available in most multivitamins, as a folic acid-only supplement, and in some foods. Taking the right amount of folic acid before conception and during pregnancy lessens the chances of having a baby born with an NTD. Giving folic acid will not affect a neural tube defect if it is already present. DIAGNOSIS   An Alpha-Fetoprotein (AFP) blood or amniotic fluid test will show high levels of the alpha-feto protein if a woman is carrying a baby with an NTD. This test is done on all pregnant women in the first trimester.   An ultrasound may detect an NTD.  WHAT YOU CAN DO:  Take a multivitamin with at least 0.4 milligrams (400 micrograms) of folic acid daily at least 4 weeks before getting pregnant and through the first 12 weeks of pregnancy.   If you have already had a pregnancy affected by an NTD, take 4 milligrams (4,000 micrograms) of folic acid daily. Take this amount 1 month before you start trying to get pregnant and continue through the first 3 months of pregnancy.   Talk to your caregiver if you are taking medicines for seizures. Your caregiver will be able to manage your seizure medicines  and your pregnancy in the best way.  FOLIC ACID IN FOODS Eat a healthy diet that has foods that contain folic acid, the natural form of the vitamin. Such foods include:  Fortified breakfast cereals.   Lentils.   Asparagus.   Spinach.   Organ meats (liver).   Black beans.   Peanuts (eat only if you do not have a peanut allergy).   Broccoli.   Strawberries, oranges.   Orange juice (from concentrate is best).   Enriched breads and pasta.   Romaine lettuce.  TALK TO YOUR CAREGIVER IF:  You are in  your first trimester and have high blood sugar.   You are in your first trimester and have an oral temperature above 102 F (38.9 C).   You are in your first trimester and had sauna treatments.   You are pregnant (or want to become pregnant) and take a prescription medicine called valproic acid.  In almost all cases, a fetus found to have an NTD will need specialized care that may not be available in all hospitals. Talk to your caregiver about what is best for you and your baby. Document Released: 01/14/2003 Document Revised: 12/31/2010 Document Reviewed: 04/16/2009 [facility] Patient Information 2012 [place], [place].Iron Deficiency Anemia  Anemia is when you have a low number of healthy red blood cells. HOME CARE   Ask your doctor or dietician what foods you should eat.   Take iron and vitamins as told by your doctor.   Eat foods that have iron in them. This includes liver, lean beef, whole-grain bread, eggs, dried fruit, and dark green leafy vegetables.  GET HELP RIGHT AWAY IF:  You pass out (faint).   You have chest pain.   You feel sick to your stomach (nauseous) or throw up (vomit).   You get very short of breath with activity.   You are weak.   You are thirstier than normal.   You have a fast heartbeat.   You start to sweat or become lightheaded when getting up from a chair or bed.  MAKE SURE YOU:  Understand these instructions.   Will watch your condition.   Will get help right away if you are not doing well or get worse.  Document Released: 02/13/2010 Document Revised: 12/31/2010 Document Reviewed: 02/13/2010 [facility] Patient Information 2012 [place], [place].Pregnancy, First Trimester The first trimester is the first 3 months your baby is growing inside you. It is important to follow your doctor's instructions. HOME CARE   Do not smoke.   Do not drink alcohol.   Only take medicine as told by your doctor.   Exercise.   Eat healthy foods. Eat regular,  well-balanced meals.   You can have sex (intercourse) if there are no other problems with the pregnancy.   Things that help with morning sickness:   Eat soda crackers before getting up in the morning.   Eat 4 to 5 small meals rather than 3 large meals.   Drink liquids between meals, not during meals.   Go to all appointments as told.   Take all vitamins or supplements as told by your doctor.  GET HELP RIGHT AWAY IF:   You develop a fever.   You have a bad smelling fluid that is leaking from your vagina.   There is bleeding from the vagina.   You develop severe belly (abdominal) or back pain.   You throw up (vomit) blood. It may look like coffee grounds.   You lose more than 2 pounds  in a week.   You gain 5 pounds or more in a week.   You gain more than 2 pounds in a week and you see puffiness (swelling) in your feet, ankles, or legs.   You have severe dizziness or pass out (faint).   You are around people who have [place] measles, chickenpox, or fifth disease.   You have a headache, watery poop (diarrhea), pain with peeing (urinating), or cannot breath right.  Document Released: 06/30/2007 Document Revised: 12/31/2010 Document Reviewed: 06/30/2007 [facility] Patient Information 2012 [place], [place].Pregnancy, First Trimester The first trimester is the first 3 months your baby is growing inside you. It is important to follow your doctor's instructions. HOME CARE   Do not smoke.   Do not drink alcohol.   Only take medicine as told by your doctor.   Exercise.   Eat healthy foods. Eat regular, well-balanced meals.   You can have sex (intercourse) if there are no other problems with the pregnancy.   Things that help with morning sickness:   Eat soda crackers before getting up in the morning.   Eat 4 to 5 small meals rather than 3 large meals.   Drink liquids between meals, not during meals.   Go to all appointments as told.   Take all vitamins or supplements  as told by your doctor.  GET HELP RIGHT AWAY IF:   You develop a fever.   You have a bad smelling fluid that is leaking from your vagina.   There is bleeding from the vagina.   You develop severe belly (abdominal) or back pain.   You throw up (vomit) blood. It may look like coffee grounds.   You lose more than 2 pounds in a week.   You gain 5 pounds or more in a week.   You gain more than 2 pounds in a week and you see puffiness (swelling) in your feet, ankles, or legs.   You have severe dizziness or pass out (faint).   You are around people who have [place] measles, chickenpox, or fifth disease.   You have a headache, watery poop (diarrhea), pain with peeing (urinating), or cannot breath right.  Document Released: 06/30/2007 Document Revised: 12/31/2010 Document Reviewed: 06/30/2007 [facility] Patient Information 2012 [place], [place].

## 2011-05-23 NOTE — MAU Note (Signed)
Pt had a home +UPT-states she is anemic and feels light headed and thnks she is pregnant

## 2011-05-23 NOTE — MAU Provider Note (Signed)
History     CSN: [id number]  Arrival date & time 05/23/11  1934   None     Chief Complaint  Patient presents with  . Possible Pregnancy    HPI Miranda Tanner is a 26 y.o. female  who presents to MAU for feeling tired and dizzy.History of anemia. Positive home pregnancy test. LMP 04/15/11, 5.[redacted] weeks gestation. She also complains lower abdominal cramping that comes and goes. She rates her pain as 3/10. Last pap smear last month at [facility] and was normal. Current sex partner x 7 years. No history of STI's.   Past Medical History  Diagnosis Date  . Urinary tract infection   . Anemia     Past Surgical History  Procedure Date  . Cesarean section   . Tonsillectomy and adenoidectomy   . Wisdom tooth extraction     Family History  Problem Relation Age of Onset  . Diabetes Maternal Grandmother   . Diabetes Maternal Grandfather     History  Substance Use Topics  . Smoking status: Never Smoker   . Smokeless tobacco: Not on file  . Alcohol Use: Yes     social     OB History    Grav Para Term Preterm Abortions TAB SAB Ect Mult Living   2 1 1       1       Review of Systems  Constitutional: Positive for fatigue. Negative for fever, chills and diaphoresis.  HENT: Positive for congestion and sneezing. Negative for ear pain, sore throat, facial swelling, neck pain, neck stiffness, dental problem and sinus pressure.   Eyes: Negative for photophobia, pain and discharge.  Respiratory: Negative for cough, chest tightness and wheezing.   Gastrointestinal: Positive for abdominal pain (cramping). Negative for nausea, vomiting, diarrhea, constipation and abdominal distention.  Genitourinary: Positive for frequency. Negative for dysuria, urgency, flank pain, vaginal bleeding, vaginal discharge and difficulty urinating.  Musculoskeletal: Positive for back pain. Negative for myalgias and gait problem.  Skin: Negative for color change and rash.  Neurological: Positive for  dizziness and headaches. Negative for speech difficulty, weakness, light-headedness and numbness.  Psychiatric/Behavioral: Negative for confusion and agitation. The patient is not nervous/anxious.     Allergies  Loratadine and Penicillins  Home Medications  No current outpatient prescriptions on file.  BP 135/79  Pulse 97  Temp(Src) 99.1 F (37.3 C) (Oral)  Resp 20  Ht 5\' 1"  (1.549 m)  Wt 145 lb (65.772 kg)  BMI 27.40 kg/m2  SpO2 100%  LMP 04/15/2011  Physical Exam  Nursing note and vitals reviewed. Constitutional: She is oriented to person, place, and time. She appears well-developed and well-nourished. No distress.  HENT:  Head: Normocephalic.  Eyes: EOM are normal.  Neck: Neck supple.  Cardiovascular: Normal rate.   Pulmonary/Chest: Effort normal.  Abdominal: Soft. There is no tenderness.  Genitourinary:       External genitalia without lesions. White discharge vaginal vault. Cervix closed, long, no CMT, no adnexal tenderness, uterus slightly enlarged.  Musculoskeletal: Normal range of motion.  Neurological: She is alert and oriented to person, place, and time. No cranial nerve deficit.  Skin: Skin is warm and dry.  Psychiatric: She has a normal mood and affect. Her behavior is normal. Judgment and thought content normal.   Results for orders placed during the hospital encounter of 05/23/11 (from the past 24 hour(s))  URINALYSIS, ROUTINE W REFLEX MICROSCOPIC     Status: Normal   Collection Time   05/23/11  7:48  PM      Component Value Range   Color, Urine YELLOW  YELLOW    APPearance CLEAR  CLEAR    Specific Gravity, Urine 1.020  1.005 - 1.030    pH 6.0  5.0 - 8.0    Glucose, UA NEGATIVE  NEGATIVE (mg/dL)   Hgb urine dipstick NEGATIVE  NEGATIVE    Bilirubin Urine NEGATIVE  NEGATIVE    Ketones, ur NEGATIVE  NEGATIVE (mg/dL)   Protein, ur NEGATIVE  NEGATIVE (mg/dL)   Urobilinogen, UA 0.2  0.0 - 1.0 (mg/dL)   Nitrite NEGATIVE  NEGATIVE    Leukocytes, UA NEGATIVE   NEGATIVE   POCT PREGNANCY, URINE     Status: Abnormal   Collection Time   05/23/11  7:56 PM      Component Value Range   Preg Test, Ur POSITIVE (*) NEGATIVE   ABO/RH     Status: Normal (Preliminary result)   Collection Time   05/23/11  8:35 PM      Component Value Range   ABO/RH(D) O NEG    CBC     Status: Abnormal   Collection Time   05/23/11  8:42 PM      Component Value Range   WBC 5.6  4.0 - 10.5 (K/uL)   RBC 3.96  3.87 - 5.11 (MIL/uL)   Hemoglobin 10.8 (*) 12.0 - 15.0 (g/dL)   HCT 16.1 (*) 09.6 - 46.0 (%)   MCV 82.1  78.0 - 100.0 (fL)   MCH 27.3  26.0 - 34.0 (pg)   MCHC 33.2  30.0 - 36.0 (g/dL)   RDW 04.5 (*) 40.9 - 15.5 (%)   Platelets 270  150 - 400 (K/uL)  DIFFERENTIAL     Status: Normal   Collection Time   05/23/11  8:42 PM      Component Value Range   Neutrophils Relative 53  43 - 77 (%)   Neutro Abs 3.0  1.7 - 7.7 (K/uL)   Lymphocytes Relative 35  12 - 46 (%)   Lymphs Abs 1.9  0.7 - 4.0 (K/uL)   Monocytes Relative 8  3 - 12 (%)   Monocytes Absolute 0.5  0.1 - 1.0 (K/uL)   Eosinophils Relative 4  0 - 5 (%)   Eosinophils Absolute 0.2  0.0 - 0.7 (K/uL)   Basophils Relative 1  0 - 1 (%)   Basophils Absolute 0.0  0.0 - 0.1 (K/uL)  HCG, QUANTITATIVE, PREGNANCY     Status: Abnormal   Collection Time   05/23/11  8:42 PM      Component Value Range   hCG, Beta Chain, Quant, S 15105 (*) <5 (mIU/mL)  WET PREP, GENITAL     Status: Abnormal   Collection Time   05/23/11  9:08 PM      Component Value Range   Yeast Wet Prep HPF POC NONE SEEN  NONE SEEN    Trich, Wet Prep NONE SEEN  NONE SEEN    Clue Cells Wet Prep HPF POC NONE SEEN  NONE SEEN    WBC, Wet Prep HPF POC MODERATE (*) NONE SEEN    US Ob Comp Less 14 Wks  05/23/2011  *RADIOLOGY REPORT*  Clinical Data: Anemia, lightheaded, pregnant. Pain.  OBSTETRIC <14 WK [name] AND TRANSVAGINAL OB US  Technique:  Both transabdominal and transvaginal ultrasound examinations were performed for complete evaluation of the gestation as  well as the maternal uterus, adnexal regions, and pelvic cul-de-sac.  Transvaginal technique was performed to assess early pregnancy.  Comparison:  None.  Intrauterine gestational sac:  Visualized/normal in shape. Yolk sac: Identified Embryo: Not identified Cardiac Activity: Not applicable  MSD: 12.3  mm  6    w 0    d [name] EDC: 01/16/2012  Maternal uterus/adnexae: No free fluid.  Normal sonographic appearance to the ovaries. Corpus luteal cyst noted on the left.  IMPRESSION: Single intrauterine gestational sac identified with estimated age of 6 weeks 0 days by mean sac diameter.  Original Report Authenticated By: Waneta Martins, M.D.   US Ob Transvaginal  05/23/2011  *RADIOLOGY REPORT*  Clinical Data: Anemia, lightheaded, pregnant. Pain.  OBSTETRIC <14 WK [name] AND TRANSVAGINAL OB US  Technique:  Both transabdominal and transvaginal ultrasound examinations were performed for complete evaluation of the gestation as well as the maternal uterus, adnexal regions, and pelvic cul-de-sac.  Transvaginal technique was performed to assess early pregnancy.  Comparison:  None.  Intrauterine gestational sac:  Visualized/normal in shape. Yolk sac: Identified Embryo: Not identified Cardiac Activity: Not applicable  MSD: 12.3  mm  6    w 0    d [name] EDC: 01/16/2012  Maternal uterus/adnexae: No free fluid.  Normal sonographic appearance to the ovaries. Corpus luteal cyst noted on the left.  IMPRESSION: Single intrauterine gestational sac identified with estimated age of 6 weeks 0 days by mean sac diameter.  Original Report Authenticated By: Waneta Martins, M.D.   Assessment: Six week IUP   Mild anemia  Plan:  Rx prenatal vitamins   Start prenatal care   Return as needed.   Discussed with patient eating small meals more frequently, getting up and down slowly, drinking plenty of fluids. ED Course  Procedures  MDM

## 2011-05-24 LAB — GC/CHLAMYDIA PROBE AMP, GENITAL: GC Probe Amp, Genital: NEGATIVE

## 2011-05-27 ENCOUNTER — Telehealth: Payer: Self-pay | Admitting: Obstetrics and Gynecology

## 2011-05-27 NOTE — Telephone Encounter (Signed)
Routed to triage/electronic 

## 2011-06-02 ENCOUNTER — Inpatient Hospital Stay (HOSPITAL_COMMUNITY)
Admission: AD | Admit: 2011-06-02 | Discharge: 2011-06-02 | Disposition: A | Discharge status: Home / Self Care | Payer: Medicaid Other | Source: Ambulatory Visit | Attending: Obstetrics and Gynecology | Admitting: Obstetrics and Gynecology

## 2011-06-02 ENCOUNTER — Encounter: Payer: Self-pay | Admitting: Obstetrics and Gynecology

## 2011-06-02 ENCOUNTER — Telehealth: Payer: Self-pay | Admitting: Obstetrics and Gynecology

## 2011-06-02 ENCOUNTER — Encounter (HOSPITAL_COMMUNITY): Payer: Self-pay | Admitting: Obstetrics and Gynecology

## 2011-06-02 ENCOUNTER — Inpatient Hospital Stay (HOSPITAL_COMMUNITY): Payer: Medicaid Other

## 2011-06-02 DIAGNOSIS — O26899 Other specified pregnancy related conditions, unspecified trimester: Secondary | ICD-10-CM | POA: Diagnosis present

## 2011-06-02 DIAGNOSIS — O26859 Spotting complicating pregnancy, unspecified trimester: Secondary | ICD-10-CM | POA: Insufficient documentation

## 2011-06-02 DIAGNOSIS — O210 Mild hyperemesis gravidarum: Secondary | ICD-10-CM | POA: Insufficient documentation

## 2011-06-02 DIAGNOSIS — Z6791 Unspecified blood type, Rh negative: Secondary | ICD-10-CM | POA: Diagnosis present

## 2011-06-02 DIAGNOSIS — O200 Threatened abortion: Secondary | ICD-10-CM

## 2011-06-02 DIAGNOSIS — O218 Other vomiting complicating pregnancy: Secondary | ICD-10-CM

## 2011-06-02 DIAGNOSIS — O34219 Maternal care for unspecified type scar from previous cesarean delivery: Secondary | ICD-10-CM | POA: Diagnosis present

## 2011-06-02 LAB — URINALYSIS, ROUTINE W REFLEX MICROSCOPIC
Hgb urine dipstick: NEGATIVE
Specific Gravity, Urine: 1.015 (ref 1.005–1.030)
Urobilinogen, UA: 0.2 mg/dL (ref 0.0–1.0)
pH: 8 (ref 5.0–8.0)

## 2011-06-02 MED ORDER — ONDANSETRON HCL 4 MG/2ML IJ SOLN
4.0000 mg | Freq: Once | INTRAMUSCULAR | Status: AC
  Administered 2011-06-02: 4 mg via INTRAVENOUS
  Filled 2011-06-02: qty 2

## 2011-06-02 MED ORDER — PANTOPRAZOLE SODIUM 40 MG IV SOLR
40.0000 mg | Freq: Once | INTRAVENOUS | Status: AC
  Administered 2011-06-02: 40 mg via INTRAVENOUS
  Filled 2011-06-02: qty 40

## 2011-06-02 MED ORDER — ONDANSETRON HCL 4 MG PO TABS: 4.0000 mg | ORAL_TABLET | Freq: Three times a day (TID) | ORAL | Status: AC | PRN

## 2011-06-02 MED ORDER — LACTATED RINGERS IV SOLN: INTRAVENOUS | Status: DC

## 2011-06-02 MED ORDER — LACTATED RINGERS IV BOLUS (SEPSIS)
500.0000 mL | Freq: Once | INTRAVENOUS | Status: AC
  Administered 2011-06-02: 500 mL via INTRAVENOUS

## 2011-06-02 MED ORDER — PROMETHAZINE HCL 25 MG PO TABS: 25.0000 mg | ORAL_TABLET | Freq: Four times a day (QID) | ORAL | Status: DC | PRN

## 2011-06-02 MED ORDER — RHO D IMMUNE GLOBULIN 1500 UNIT/2ML IJ SOLN
300.0000 ug | Freq: Once | INTRAMUSCULAR | Status: AC
  Administered 2011-06-02: 300 ug via INTRAVENOUS

## 2011-06-02 NOTE — Telephone Encounter (Signed)
TC to pt.   States was seen at hospital 05/29/11 for vision spots and was told is anemic.  States has been vomiting x several days and not able to retain food or fluids.  Also states had brown spotting 2 days ago.  Knows she is RH neg.   Per [facility], pt to MAU.   VL notified.

## 2011-06-02 NOTE — MAU Provider Note (Signed)
History   26 yo G2P1001 at 7 3/[redacted] weeks gestation by 6 week [name] presented after calling office with c/o not keeping any food or fluids down for several days, also with spotting and known O negative blood type.  Had [name] on 4/28 in MAU with IUP at 6 weeks, but unable to see cardiac activity.  Was dizzy and weak at that time, with hgb 10.8.  Cultures were done on 4/28 and were negative.  Hx remarkable for: Previous C/S 1st trimester spotting Rh negative Allergy to loratadine, PCN    OB History    Grav Para Term Preterm Abortions TAB SAB Ect Mult Living   2 1 1       1       Past Medical History  Diagnosis Date  . Urinary tract infection   . Anemia     Past Surgical History  Procedure Date  . Cesarean section   . Tonsillectomy and adenoidectomy   . Wisdom tooth extraction     Family History  Problem Relation Age of Onset  . Diabetes Maternal Grandmother   . Diabetes Maternal Grandfather     History  Substance Use Topics  . Smoking status: Never Smoker   . Smokeless tobacco: Not on file  . Alcohol Use: Yes     social     Allergies:  Allergies  Allergen Reactions  . Loratadine Hives  . Penicillins Swelling    Tongue swelling    Prescriptions prior to admission  Medication Sig Dispense Refill  . diphenhydrAMINE (BENADRYL) 25 mg capsule Take 25 mg by mouth every 6 (six) hours as needed. For cold symptoms      . Prenatal Vitamins (DIS) TABS Take 1 tablet by mouth daily.  30 tablet  3     Physical Exam   Height 5\' 1"  (1.549 m), weight 143 lb 6 oz (65.034 kg), last menstrual period 04/15/2011.  Chest clear Heart RRR without murmur Abd soft, NT Pelvic--cervix closed, long. Small amount brown d/c noted, no active bleeding.  ED Course  Early pregnancy N/V 1st trimester spotting Rh negative  Plan: Check UA for hydration status IV hydration Zofran and Protonix [name] for viability Rhophylac w/u and administration  Nigel Bridgeman, CNM, [place] 06/02/11  12p  Addendum: Received 1 bag IVF, Zofran, and Protonix--feeling much better now.  Tolerating po fluids without difficulty. Received Rhophylac. US--SIUP at 7 2/7 weeks, with [facility] 01/17/12.  FHR 144.   Will d/c home on pelvic rest until [place], instructions for dealing with N/V of pregnancy. Rx Zofran 4 mg po TID prn, Protonix 40 mg po q day.  Follow-up as scheduled (around 06/22/11) or prn.  Nigel Bridgeman, CNM, [place] 06/02/11 2:05p

## 2011-06-02 NOTE — MAU Note (Signed)
Vomiting a lot. Spotting this AM.

## 2011-06-02 NOTE — Discharge Instructions (Signed)
Morning Sickness Morning sickness is when you feel sick to your stomach (nauseous) during pregnancy. You may feel sick to your stomach and throw up (vomit). You may feel sick in the morning, but you can feel this way any time of day. Some women feel very sick to their stomach and cannot stop throwing up (hyperemesis gravidarum). HOME CARE  Take multivitamins as told by your doctor. Taking multivitamins before getting pregnant can stop or lessen the harshness of morning sickness.   Eat dry toast or unsalted crackers before getting out of bed.   Eat 5 to 6 small meals a day.   Eat dry and bland foods like rice and baked potatoes.   Do not drink liquids with meals. Drink between meals.   Do not eat greasy, fatty, or spicy foods.   Have someone cook for you if the smell of food causes you to feel sick or throw up.   Do not take vitamins with iron, or as told by your doctor.   Eat protein when you need a snack (nuts, yogurt, cheese).   Eat unsweetened gelatins for dessert.   Wear a bracelet used for sea sickness (acupressure wristband).   Go to a doctor that puts thin needles into certain body points (acupuncture) to improve how you feel.   Do not smoke.   Use a humidifier to keep the air in your house free of odors.  GET HELP RIGHT AWAY IF:   You feel very sick to your stomach and cannot stop throwing up.   You pass out (faint).   You have a fever.   You need medicine to feel better.   You feel dizzy or lightheaded.   You are losing weight.   You need help knowing what to eat and what not to eat.  MAKE SURE YOU:   Understand these instructions.   Will watch your condition.   Will get help right away if you are not doing well or get worse.  Document Released: 02/19/2004 Document Revised: 12/31/2010 Document Reviewed: 04/10/2009 Exit[employer] Patient Information 2012 [employer]. 

## 2011-06-03 LAB — RH IG WORKUP (INCLUDES ABO/RH)
ABO/RH(D): O NEG
Gestational Age(Wks): 7

## 2011-07-14 ENCOUNTER — Encounter: Payer: Self-pay | Admitting: Obstetrics and Gynecology

## 2011-07-14 ENCOUNTER — Ambulatory Visit (INDEPENDENT_AMBULATORY_CARE_PROVIDER_SITE_OTHER): Payer: Medicaid Other | Admitting: Obstetrics and Gynecology

## 2011-07-14 VITALS — BP 100/62 | Temp 98.4°F | Ht 61.75 in | Wt 148.0 lb

## 2011-07-14 DIAGNOSIS — K5900 Constipation, unspecified: Secondary | ICD-10-CM

## 2011-07-14 DIAGNOSIS — N390 Urinary tract infection, site not specified: Secondary | ICD-10-CM | POA: Insufficient documentation

## 2011-07-14 DIAGNOSIS — Z3490 Encounter for supervision of normal pregnancy, unspecified, unspecified trimester: Secondary | ICD-10-CM

## 2011-07-14 DIAGNOSIS — O210 Mild hyperemesis gravidarum: Secondary | ICD-10-CM

## 2011-07-14 DIAGNOSIS — Z331 Pregnant state, incidental: Secondary | ICD-10-CM

## 2011-07-14 DIAGNOSIS — D649 Anemia, unspecified: Secondary | ICD-10-CM

## 2011-07-14 LAB — POCT URINALYSIS DIPSTICK
Bilirubin, UA: NEGATIVE
Blood, UA: NEGATIVE
Glucose, UA: NEGATIVE
Ketones, UA: NEGATIVE
Leukocytes, UA: NEGATIVE
Nitrite, UA: NEGATIVE
Protein, UA: NEGATIVE
Spec Grav, UA: 1.015
Urobilinogen, UA: NEGATIVE
pH, UA: 6

## 2011-07-14 LAB — POCT URINE PREGNANCY: Preg Test, Ur: POSITIVE

## 2011-07-14 NOTE — Patient Instructions (Signed)
Take 1/2 of Phenergan as directed  Miralax as directed daily (take the daily dose for chronic constipation)  may take laxative dose if one is needed.  (comes in pill form or powder to be mixed with liquid)

## 2011-07-14 NOTE — Progress Notes (Signed)
PT WAS SEEN AT [facility] BY ONE OF OUR MIDWIFES

## 2011-07-14 NOTE — Progress Notes (Signed)
26 YO pregnant (12 weeks 6 days by dates) complains of nausea and vomiting since April.  Seen at [facility] and given phenergan but it knocks her out and she has another child to care for.  Was given Zofran but often it will make her vomit and has caused her to be constipated.  Able to keep down broth liquids, some fruits, and some veggies. Last bowel movement 1 week ago but only after taking Milk of Magnesia.  Per 05/23/11 ultrasound patient is 13 weeks and 3 days. Denies urinary tract symptoms, dyspareunia, vaginal bleeding, pelvic pain or vaginitis symptoms.  Has tried B-6, Sea-Bands & ginger.   O: Abdomen: soft, non-tender, FHT= 140 bpm     U/A-negative  A: Hyperemesis     Constipation     Pregnant 13 w 3 d  P: Increase hydration         Miralax daily dose for chronic constipation but may take     laxative dose if needed     Continue Phenergan 25 mg 1/2 po q 6 hours     RTO-NOB  Miranda Mcfarlane, PA-C

## 2011-07-14 NOTE — Progress Notes (Signed)
Regular Periods: no Mammogram: no  Monthly Breast Ex.: yes Exercise: yes  Tetanus < 10 years: no Seatbelts: yes  NI. Bladder Functn.: yes Abuse at home: no  Daily BM's: no Stressful Work: no  Healthy Diet: yes Sigmoid-Colonoscopy: NO  Calcium: no Medical problems this year: NAUSEA,VOMITING AND CONSTIPATION   LAST PAP2013  NL  Contraception: NONE  Mammogram:  NO  PCP: NO  PMH: NO CHANGE  FMH: NO CHANGE  Last Bone Scan: NO

## 2011-07-25 ENCOUNTER — Telehealth: Payer: Self-pay | Admitting: Obstetrics and Gynecology

## 2011-07-25 NOTE — Telephone Encounter (Signed)
TC from patient--15 weeks with N/V.  On medication, but also struggling with reflux--needs guidance regarding med options. Recommend Zantac or Pepcid Complete, with dosages discussed. Other comfort measures reviewed. Has interview on Tuesday.  F/U prn.

## 2011-07-27 ENCOUNTER — Ambulatory Visit (INDEPENDENT_AMBULATORY_CARE_PROVIDER_SITE_OTHER): Payer: Medicaid Other | Admitting: Obstetrics and Gynecology

## 2011-07-27 DIAGNOSIS — Z331 Pregnant state, incidental: Secondary | ICD-10-CM

## 2011-07-27 DIAGNOSIS — IMO0002 Reserved for concepts with insufficient information to code with codable children: Secondary | ICD-10-CM | POA: Insufficient documentation

## 2011-07-27 MED ORDER — PROMETHAZINE HCL 25 MG PO TABS: 25.0000 mg | ORAL_TABLET | Freq: Four times a day (QID) | ORAL | Status: DC | PRN

## 2011-07-27 NOTE — Progress Notes (Signed)
Chem 10  SG  1.010  Protein   8   All else negative

## 2011-07-28 LAB — PRENATAL PANEL VII
HIV: NONREACTIVE
Hemoglobin: 11.2 g/dL — ABNORMAL LOW (ref 12.0–15.0)
Hepatitis B Surface Ag: NEGATIVE
Lymphocytes Relative: 17 % (ref 12–46)
Lymphs Abs: 1.3 10*3/uL (ref 0.7–4.0)
Monocytes Relative: 11 % (ref 3–12)
Neutro Abs: 5.3 10*3/uL (ref 1.7–7.7)
Neutrophils Relative %: 69 % (ref 43–77)
RBC: 4.06 MIL/uL (ref 3.87–5.11)
Rubella: 40.7 IU/mL — ABNORMAL HIGH

## 2011-07-30 LAB — PRENATAL ANTIBODY IDENTIFICATION

## 2011-07-30 LAB — HEMOGLOBINOPATHY EVALUATION
Hgb F Quant: 0 % (ref 0.0–2.0)
Hgb S Quant: 0 %

## 2011-08-02 ENCOUNTER — Encounter: Payer: Self-pay | Admitting: Obstetrics and Gynecology

## 2011-08-02 ENCOUNTER — Ambulatory Visit (INDEPENDENT_AMBULATORY_CARE_PROVIDER_SITE_OTHER): Payer: Medicaid Other | Admitting: Obstetrics and Gynecology

## 2011-08-02 VITALS — BP 110/70 | Wt 155.0 lb

## 2011-08-02 DIAGNOSIS — Z3490 Encounter for supervision of normal pregnancy, unspecified, unspecified trimester: Secondary | ICD-10-CM

## 2011-08-02 DIAGNOSIS — Z1379 Encounter for other screening for genetic and chromosomal anomalies: Secondary | ICD-10-CM

## 2011-08-02 DIAGNOSIS — Z331 Pregnant state, incidental: Secondary | ICD-10-CM

## 2011-08-02 NOTE — Progress Notes (Signed)
C/o pelvic pain only at bed night  Agrees to genetic screening  No void yet

## 2011-08-02 NOTE — Progress Notes (Deleted)
Subjective:    Miranda Tanner is being seen today for her first obstetrical visit at [redacted]w[redacted]d gestation by LMP (sure of dates)  She reports having been seen at the hospital 3 times for N&V and had been seen by VL - has ahd IV hydration and takes phenergan 12.5 - 25 mgs po PRN  Her obstetrical history is significant for: Patient Active Problem List  Diagnosis  . ANEMIA, IRON DEFICIENCY  . Rh negative state in antepartum period  . Previous cesarean delivery affecting pregnancy  . Urinary tract infection  . Anemia  . Abnormal Pap smear  [facility] 2013 [facility])  Relationship with FOB:  Married  Patient does intend to breast feed.  Did not breast-feed her first child and anxious to try this time.  Pregnancy history fully reviewed.   Review of Systems Pertinent ROS is described in HPI   Objective:   BP 110/70  Wt 155 lb (70.308 kg)  LMP 04/15/2011 Wt Readings from Last 1 Encounters:  08/02/11 155 lb (70.308 kg)   BMI: 28.2  General: alert, cooperative and no distress Respiratory: clear to auscultation bilaterally Cardiovascular: regular rate and rhythm, S1, S2 normal, no murmur Breasts:  No dominant masses, nipples erect Gastrointestinal: soft, non-tender; no masses,  no organomegaly Extremities: extremities normal, no pain or edema Vaginal Bleeding: None  EXTERNAL GENITALIA: normal appearing vulva with no masses, tenderness or lesions VAGINA: no abnormal discharge or lesions CERVIX: no lesions or cervical motion tenderness; cervix closed, long, firm UTERUS: gravid and consistent with15 weeks ADNEXA: no masses palpable and nontender OB EXAM PELVIMETRY: adequate   FHR:  135  bpm  Assessment:    Pregnancy at [redacted]w[redacted]d   Plan:     Prenatal panel reviewed and discussed with the patient: yes - had PN Panel drawn at the hospital. Blood type : O neg Pap smear collected: last pap April 2013 - [facility]) GC/Chlamydia collected:  done [employer] prep: PH - 5.0 deferred Discussion of Genetic testing options: AFP today Prenatal vitamins recommended Problem list reviewed and updated.  Plan of care: Follow up in 4 weeks for ROB and 3 weeks for Anatomy USS - patient to schedule  Earl Gala CNM, [place] 08/02/2011 1:16 PM                                                              Subjective:    Miranda Tanner is being seen today for her first obstetrical visit at [redacted]w[redacted]d gestation by ***.  She reports ***  Her obstetrical history is significant for: Patient Active Problem List  Diagnosis  . ANEMIA, IRON DEFICIENCY  . Rh negative state in antepartum period  . Previous cesarean delivery affecting pregnancy  . Urinary tract infection  . Anemia  . Abnormal Pap smear    Relationship with FOB:  ***  Patient {does/does not:19097} intend to breast feed.   Pregnancy history fully reviewed.  {Common ambulatory SmartLinks:19316}  Review of Systems Pertinent ROS is described in HPI   Objective:   BP 110/70  Wt 155 lb (70.308 kg)  LMP 04/15/2011 Wt Readings from Last 1 Encounters:  08/02/11 155 lb (70.308 kg)   BMI: There is no height on file to calculate BMI.  General: alert, cooperative  and no distress Respiratory: clear to auscultation bilaterally Cardiovascular: regular rate and rhythm, S1, S2 normal, no murmur Breasts:  No dominant masses, nipples erect Gastrointestinal: soft, non-tender; no masses,  no organomegaly Extremities: extremities normal, no pain or edema Vaginal Bleeding: None  EXTERNAL GENITALIA: normal appearing vulva with no masses, tenderness or lesions VAGINA: no abnormal discharge or lesions CERVIX: no lesions or cervical motion tenderness; cervix closed, long, firm UTERUS: gravid and consistent with *** weeks ADNEXA: no masses palpable and nontender OB EXAM PELVIMETRY: {pe pelvimetry:313269::"appears adequate"}   FHR:  ***  bpm  Assessment:    Pregnancy at    Plan:     Prenatal panel reviewed and discussed with the patient:{YES NO:22349::"Labs drawn today"} Pap smear collected:{YES NO:22349::"Not applicable"} GC/Chlamydia collected:{YES NO:22349} Wet prep:  *** Discussion of Genetic testing options: *** Prenatal vitamins recommended Problem list reviewed and updated.  Plan of care: Follow up in *** weeks for ***  Earl Gala CNM, [place] 08/02/2011 1:17 PM

## 2011-08-03 LAB — GC/CHLAMYDIA PROBE AMP, GENITAL: Chlamydia, DNA Probe: NEGATIVE

## 2011-08-04 LAB — ALPHA FETOPROTEIN, MATERNAL
Curr Gest Age: 16 wks.days
MoM for AFP: 0.49
Osb Risk: <1:54600 {titer}

## 2011-08-18 ENCOUNTER — Ambulatory Visit (INDEPENDENT_AMBULATORY_CARE_PROVIDER_SITE_OTHER): Payer: Medicaid Other

## 2011-08-18 ENCOUNTER — Telehealth: Payer: Self-pay | Admitting: Obstetrics and Gynecology

## 2011-08-18 ENCOUNTER — Ambulatory Visit (INDEPENDENT_AMBULATORY_CARE_PROVIDER_SITE_OTHER): Payer: Medicaid Other | Admitting: Obstetrics and Gynecology

## 2011-08-18 VITALS — BP 122/72 | Wt 154.0 lb

## 2011-08-18 DIAGNOSIS — Z3689 Encounter for other specified antenatal screening: Secondary | ICD-10-CM

## 2011-08-18 DIAGNOSIS — Z331 Pregnant state, incidental: Secondary | ICD-10-CM

## 2011-08-18 DIAGNOSIS — Z3490 Encounter for supervision of normal pregnancy, unspecified, unspecified trimester: Secondary | ICD-10-CM

## 2011-08-18 DIAGNOSIS — O34219 Maternal care for unspecified type scar from previous cesarean delivery: Secondary | ICD-10-CM

## 2011-08-18 DIAGNOSIS — O3421 Maternal care for scar from previous cesarean delivery: Secondary | ICD-10-CM

## 2011-08-18 MED ORDER — METOCLOPRAMIDE HCL 10 MG PO TABS: 10.0000 mg | ORAL_TABLET | Freq: Four times a day (QID) | ORAL | Status: DC | PRN

## 2011-08-18 NOTE — Progress Notes (Signed)
Ultrasound today consistent with dates 39.4 percentile cervix 4.4 cm bilateral ovaries within normal limits some heart views were unable to be seen otherwise no anomalies female gender normal fluid anterior placenta three-vessel cord U/S reviewed F/u limited anatomy scan at [place] in 4wks Rx reglan (phenergan and zofran not working) ROI for op note done at [facility] by LJM Please address h/o abnl pap at [facility] and timing of next pap

## 2011-08-18 NOTE — Telephone Encounter (Signed)
Called pt mess. Left to call back need to sch quad screen per AR.

## 2011-08-19 ENCOUNTER — Other Ambulatory Visit: Payer: Medicaid Other

## 2011-08-19 DIAGNOSIS — Z331 Pregnant state, incidental: Secondary | ICD-10-CM

## 2011-08-23 LAB — AFP, QUAD SCREEN
AFP: 22.4 IU/mL
Age Alone: 1:826 {titer}
Curr Gest Age: 18 wks.days
Down Syndrome Scr Risk Est: 1:4050 {titer}
HCG, Total: 19026 m[IU]/mL
INH: 142.1 pg/mL
MoM for INH: 0.81
Open Spina bifida: NEGATIVE

## 2011-08-26 LAB — US OB COMP + 14 WK

## 2011-08-30 ENCOUNTER — Telehealth: Payer: Self-pay

## 2011-09-01 ENCOUNTER — Encounter: Payer: Medicaid Other | Admitting: Obstetrics and Gynecology

## 2011-09-08 ENCOUNTER — Telehealth: Payer: Self-pay | Admitting: Obstetrics and Gynecology

## 2011-09-08 NOTE — Telephone Encounter (Signed)
LM for pt to cb re message she left re dental note. I gave her my direct extention. JO, CMA

## 2011-09-13 ENCOUNTER — Ambulatory Visit (INDEPENDENT_AMBULATORY_CARE_PROVIDER_SITE_OTHER): Payer: Medicaid Other | Admitting: Obstetrics and Gynecology

## 2011-09-13 ENCOUNTER — Ambulatory Visit (INDEPENDENT_AMBULATORY_CARE_PROVIDER_SITE_OTHER): Payer: Medicaid Other

## 2011-09-13 VITALS — BP 120/72 | Wt 158.0 lb

## 2011-09-13 DIAGNOSIS — Z3689 Encounter for other specified antenatal screening: Secondary | ICD-10-CM

## 2011-09-13 DIAGNOSIS — Z331 Pregnant state, incidental: Secondary | ICD-10-CM

## 2011-09-13 NOTE — Progress Notes (Signed)
Pt needs a Dental Note.  Pt has no other concerns.

## 2011-09-13 NOTE — Progress Notes (Signed)
[redacted]w[redacted]d Doing well. Quad screen normal. Return office in 4 weeks. Note to see dentist. Dr. Stefano Gaul

## 2011-09-13 NOTE — Progress Notes (Signed)
Ultrasound: Single gestation, normal fluid, cervix 3.45 cm, 25 weeks and 5 days (41st percentile), mild pyelectasis. Repeat ultrasound in 6 weeks. Dr. Stefano Gaul

## 2011-09-20 LAB — US OB FOLLOW UP

## 2011-10-11 ENCOUNTER — Encounter: Payer: Medicaid Other | Admitting: Obstetrics and Gynecology

## 2011-10-14 ENCOUNTER — Ambulatory Visit (INDEPENDENT_AMBULATORY_CARE_PROVIDER_SITE_OTHER): Payer: Medicaid Other | Admitting: Obstetrics and Gynecology

## 2011-10-14 ENCOUNTER — Ambulatory Visit (INDEPENDENT_AMBULATORY_CARE_PROVIDER_SITE_OTHER): Payer: Medicaid Other

## 2011-10-14 VITALS — BP 110/70 | Wt 161.0 lb

## 2011-10-14 DIAGNOSIS — Z1389 Encounter for screening for other disorder: Secondary | ICD-10-CM

## 2011-10-14 DIAGNOSIS — O358XX0 Maternal care for other (suspected) fetal abnormality and damage, not applicable or unspecified: Secondary | ICD-10-CM

## 2011-10-14 DIAGNOSIS — Z331 Pregnant state, incidental: Secondary | ICD-10-CM

## 2011-10-14 DIAGNOSIS — R110 Nausea: Secondary | ICD-10-CM

## 2011-10-14 MED ORDER — METOCLOPRAMIDE HCL 10 MG PO TABS: 10.0000 mg | ORAL_TABLET | Freq: Four times a day (QID) | ORAL | Status: DC

## 2011-10-14 NOTE — Progress Notes (Signed)
[redacted]w[redacted]d Complains of nausea.  Reglan works best.  Prescription sent. Return to office in 3 weeks. Glucola next visit. Ultrasound next visit followup pyelectasis. VBAC consent form signed.  Patient wants vaginal delivery.  She does not want tubal ligation.  Vasectomy discussed. Dr. Stefano Gaul

## 2011-11-01 ENCOUNTER — Other Ambulatory Visit: Payer: Self-pay | Admitting: Obstetrics and Gynecology

## 2011-11-01 DIAGNOSIS — Z331 Pregnant state, incidental: Secondary | ICD-10-CM | POA: Insufficient documentation

## 2011-11-01 DIAGNOSIS — O358XX0 Maternal care for other (suspected) fetal abnormality and damage, not applicable or unspecified: Secondary | ICD-10-CM

## 2011-11-04 ENCOUNTER — Ambulatory Visit (INDEPENDENT_AMBULATORY_CARE_PROVIDER_SITE_OTHER): Payer: Medicaid Other

## 2011-11-04 ENCOUNTER — Encounter: Payer: Self-pay | Admitting: Obstetrics and Gynecology

## 2011-11-04 ENCOUNTER — Ambulatory Visit (INDEPENDENT_AMBULATORY_CARE_PROVIDER_SITE_OTHER): Payer: Medicaid Other | Admitting: Obstetrics and Gynecology

## 2011-11-04 VITALS — BP 116/66 | Wt 164.0 lb

## 2011-11-04 DIAGNOSIS — Z1389 Encounter for screening for other disorder: Secondary | ICD-10-CM

## 2011-11-04 DIAGNOSIS — Z331 Pregnant state, incidental: Secondary | ICD-10-CM

## 2011-11-04 DIAGNOSIS — O358XX0 Maternal care for other (suspected) fetal abnormality and damage, not applicable or unspecified: Secondary | ICD-10-CM

## 2011-11-04 LAB — HEMOGLOBIN: Hemoglobin: 8.5 g/dL — ABNORMAL LOW (ref 12.0–15.0)

## 2011-11-04 NOTE — Progress Notes (Signed)
Patient ID: Miranda Tanner, female   DOB: [birth year], 26 y.o.   MRN: [id number] [redacted]w[redacted]d 1 gtt rpr hgb today, to [facility] for rhophlyac discussed. Breech on [name] discussed exercise for breech risk of SROM to [facility] right away. Reviewed s/s preterm labor, srom, vag bleeding,daily kick counts to report, encouraged 8 water daily and frequent voids. Lavera Guise, CNM

## 2011-11-04 NOTE — Progress Notes (Signed)
[redacted]w[redacted]d  No concerns Ultrasound Comments: Breech oblique presentation. Head maternal left. Anterior placenta.  Amniotic fluid is low normal for 29 weeks.  AFI of 12.5 cm = 30th% Normal linear growth. EFW = 54th% No fetal pyelectasis is seen today. Transverse and sagittal views of kidneys are normal appearing cx closed.

## 2011-11-09 ENCOUNTER — Telehealth: Payer: Self-pay | Admitting: Obstetrics and Gynecology

## 2011-11-09 ENCOUNTER — Inpatient Hospital Stay (HOSPITAL_COMMUNITY)
Admission: AD | Admit: 2011-11-09 | Discharge: 2011-11-09 | Disposition: A | Discharge status: Home / Self Care | Payer: Medicaid Other | Source: Ambulatory Visit | Attending: Obstetrics and Gynecology | Admitting: Obstetrics and Gynecology

## 2011-11-09 DIAGNOSIS — Z2989 Encounter for other specified prophylactic measures: Secondary | ICD-10-CM | POA: Insufficient documentation

## 2011-11-09 DIAGNOSIS — Z3480 Encounter for supervision of other normal pregnancy, unspecified trimester: Secondary | ICD-10-CM | POA: Insufficient documentation

## 2011-11-09 DIAGNOSIS — Z331 Pregnant state, incidental: Secondary | ICD-10-CM

## 2011-11-09 DIAGNOSIS — Z298 Encounter for other specified prophylactic measures: Secondary | ICD-10-CM | POA: Insufficient documentation

## 2011-11-09 MED ORDER — RHO D IMMUNE GLOBULIN 1500 UNIT/2ML IJ SOLN
300.0000 ug | Freq: Once | INTRAMUSCULAR | Status: AC
  Administered 2011-11-09: 300 ug via INTRAMUSCULAR
  Filled 2011-11-09: qty 2

## 2011-11-09 NOTE — Telephone Encounter (Signed)
Message copied by Mason Jim on Tue Nov 09, 2011 11:47 AM ------      Message from: [facility], Corrie Dandy      Created: Mon Nov 08, 2011  9:10 PM      Regarding: anemia RX       FeSO4 1 po bid, #60 refill x1      Colace 2 po daily #60, refill x 1      Discuss nutrition for anemia, no calcium with iron do not take same time as PNV, continue PNV

## 2011-11-09 NOTE — Telephone Encounter (Signed)
Message copied by Mason Jim on Tue Nov 09, 2011 11:51 AM ------      Message from: [facility], Corrie Dandy      Created: Mon Nov 08, 2011  9:10 PM      Regarding: anemia RX       FeSO4 1 po bid, #60 refill x1      Colace 2 po daily #60, refill x 1      Discuss nutrition for anemia, no calcium with iron do not take same time as PNV, continue PNV

## 2011-11-09 NOTE — Telephone Encounter (Signed)
TC to pt. Informed of recommendations regarding anemia. Suggested pt take with food and after med for nausea is effective. Pt verbalizes comprehension. +FM

## 2011-11-10 LAB — RH IG WORKUP (INCLUDES ABO/RH)
Antibody Screen: NEGATIVE
Fetal Screen: NEGATIVE
Unit division: 0

## 2011-11-18 ENCOUNTER — Ambulatory Visit (INDEPENDENT_AMBULATORY_CARE_PROVIDER_SITE_OTHER): Payer: Medicaid Other | Admitting: Obstetrics and Gynecology

## 2011-11-18 VITALS — BP 120/60 | Wt 167.0 lb

## 2011-11-18 DIAGNOSIS — Z331 Pregnant state, incidental: Secondary | ICD-10-CM

## 2011-11-18 NOTE — Progress Notes (Signed)
[redacted]w[redacted]d Complains of back pain.  Sore lower back on exam.  No CVA tenderness. Tylenol and heat recommended. Instructions printed for back pain during pregnancy. Hemoglobin 8.5.  Glucola 112.  RPR nonreactive. Management of anemia discussed. Return to office in 2 weeks. Dr. Stefano Gaul

## 2011-11-18 NOTE — Progress Notes (Signed)
[redacted]w[redacted]d  C/O: skin irritation and itching , and increased back pain especially at night.

## 2011-11-18 NOTE — Patient Instructions (Signed)

## 2011-11-21 ENCOUNTER — Other Ambulatory Visit: Payer: Self-pay | Admitting: Obstetrics and Gynecology

## 2011-11-21 ENCOUNTER — Inpatient Hospital Stay (HOSPITAL_COMMUNITY)
Admission: AD | Admit: 2011-11-21 | Discharge: 2011-11-22 | Disposition: A | Discharge status: Hospice | Payer: Medicaid Other | Source: Ambulatory Visit | Attending: Obstetrics and Gynecology | Admitting: Obstetrics and Gynecology

## 2011-11-21 ENCOUNTER — Telehealth: Payer: Self-pay | Admitting: Obstetrics and Gynecology

## 2011-11-21 DIAGNOSIS — O219 Vomiting of pregnancy, unspecified: Secondary | ICD-10-CM | POA: Diagnosis present

## 2011-11-21 DIAGNOSIS — O212 Late vomiting of pregnancy: Secondary | ICD-10-CM | POA: Insufficient documentation

## 2011-11-21 LAB — COMPREHENSIVE METABOLIC PANEL
ALT: 10 U/L (ref 0–35)
Alkaline Phosphatase: 174 U/L — ABNORMAL HIGH (ref 39–117)
CO2: 22 mEq/L (ref 19–32)
Calcium: 9.4 mg/dL (ref 8.4–10.5)
Chloride: 104 mEq/L (ref 96–112)
GFR calc Af Amer: >90 mL/min (ref >90)
GFR calc non Af Amer: >90 mL/min (ref >90)
Glucose, Bld: 83 mg/dL (ref 70–99)
Potassium: 3.6 mEq/L (ref 3.5–5.1)
Sodium: 136 mEq/L (ref 135–145)
Total Bilirubin: 0.8 mg/dL (ref 0.3–1.2)

## 2011-11-21 LAB — URINALYSIS, ROUTINE W REFLEX MICROSCOPIC
Bilirubin Urine: NEGATIVE
Hgb urine dipstick: NEGATIVE
Ketones, ur: 15 mg/dL — AB
Nitrite: NEGATIVE
Urobilinogen, UA: 1 mg/dL (ref 0.0–1.0)

## 2011-11-21 LAB — CBC WITH DIFFERENTIAL/PLATELET
Eosinophils Relative: 2 % (ref 0–5)
Lymphocytes Relative: 23 % (ref 12–46)
Lymphs Abs: 1.7 10*3/uL (ref 0.7–4.0)
MCV: 74.2 fL — ABNORMAL LOW (ref 78.0–100.0)
Neutro Abs: 4.8 10*3/uL (ref 1.7–7.7)
Platelets: 258 10*3/uL (ref 150–400)
RBC: 3.91 MIL/uL (ref 3.87–5.11)
WBC: 7.2 10*3/uL (ref 4.0–10.5)

## 2011-11-21 MED ORDER — PANTOPRAZOLE SODIUM 40 MG IV SOLR
40.0000 mg | Freq: Once | INTRAVENOUS | Status: AC
  Administered 2011-11-21: 40 mg via INTRAVENOUS
  Filled 2011-11-21: qty 40

## 2011-11-21 MED ORDER — ONDANSETRON HCL 4 MG/2ML IJ SOLN
4.0000 mg | Freq: Once | INTRAMUSCULAR | Status: AC
  Administered 2011-11-21: 4 mg via INTRAVENOUS
  Filled 2011-11-21: qty 2

## 2011-11-21 MED ORDER — LACTATED RINGERS IV SOLN: INTRAVENOUS | Status: DC

## 2011-11-21 MED ORDER — ONDANSETRON 4 MG PO TBDP: 4.0000 mg | ORAL_TABLET | Freq: Three times a day (TID) | ORAL | Status: DC | PRN

## 2011-11-21 MED ORDER — LACTATED RINGERS IV BOLUS (SEPSIS)
500.0000 mL | Freq: Once | INTRAVENOUS | Status: AC
  Administered 2011-11-21: 500 mL via INTRAVENOUS

## 2011-11-21 NOTE — Telephone Encounter (Signed)
TC from patient--31 weeks, N/V x 3 days.  Started Fe recently, but intolerant. Hx N/V in early pregnancy, but had been doing well till recently. Has Reglan at home, but not helping. Feels weak.  Come to MAU.

## 2011-11-21 NOTE — MAU Provider Note (Signed)
History   26 yo G2P1001 at 35 3/7 weeks presented after calling with N/V x 3 days--started Fe due to anemia, but began vomiting after that.  Has been unable to keep f/f down.  Has Reglan at home, but has not helped.  Also c/o itching of skin, particularly at night, with fine rash on abdomen.  Denies HA, visual symptoms, epigastric pain.  No diarrhea, cramping, or constipation, reports +FM.  Had N/V early in pregnancy, but resolved.  Patient Active Problem List  Diagnosis  . ANEMIA, IRON DEFICIENCY  . Rh negative state in antepartum period  . Previous cesarean delivery affecting pregnancy  . Urinary tract infection  . Anemia  . Abnormal Pap smear  . Pregnant state, incidental     OB History    Grav Para Term Preterm Abortions TAB SAB Ect Mult Living   2 1 1       1       Past Medical History  Diagnosis Date  . Urinary tract infection   . Anemia   . Abnormal Pap smear 2004    ?LGSIL  with Cryo  . GERD (gastroesophageal reflux disease)     Past Surgical History  Procedure Date  . Cesarean section   . Tonsillectomy and adenoidectomy   . Wisdom tooth extraction   . Cryotherapy 2004    Family History  Problem Relation Age of Onset  . Diabetes Maternal Grandmother   . Stroke Maternal Grandmother 81  . Diabetes Maternal Grandfather     History  Substance Use Topics  . Smoking status: Never Smoker   . Smokeless tobacco: Never Used  . Alcohol Use: No     social - none since pregnant    Allergies:  Allergies  Allergen Reactions  . Shellfish Allergy Anaphylaxis    PT IS ALLERGIC TO CRABMEAT  . Loratadine Hives  . Penicillins Swelling    Tongue swelling    Prescriptions prior to admission  Medication Sig Dispense Refill  . diphenhydramine-acetaminophen (TYLENOL PM) 25-500 MG TABS Take 1 tablet by mouth at bedtime as needed.      . ferrous sulfate 325 (65 FE) MG tablet Take 325 mg by mouth daily with breakfast.      . metoCLOPramide (REGLAN) 10 MG tablet Take 10 mg  by mouth every 4 (four) hours. For nausea      . Prenatal Vit-Fe Fumarate-FA (PRENATAL MULTIVITAMIN) TABS Take 1 tablet by mouth every morning.         Physical Exam   Last menstrual period 04/15/2011.  Chest clear Heart RRR without murmur Abd gravid, NT Pelvic--deferred Ext WNL  FHR reactive, no decels. No contractions   ED Course  IUPa t 31 3/7 weeks N/V  Plan: Hydrate with LR Zofran UA, CBC, diff, CMP.    Nigel Bridgeman CNM, [place] 11/21/2011 10:39 PM    Addendum: Received 1 bag IVF, with Zofran IV. Feeling much better now, tolerating po fluids and cracker without N/V.  Results for orders placed during the hospital encounter of 11/21/11 (from the past 24 hour(s))  CBC WITH DIFFERENTIAL     Status: Abnormal   Collection Time   11/21/11 10:15 PM      Component Value Range   WBC 7.2  4.0 - 10.5 K/uL   RBC 3.91  3.87 - 5.11 MIL/uL   Hemoglobin 9.1 (*) 12.0 - 15.0 g/dL   HCT 16.1 (*) 09.6 - 04.5 %   MCV 74.2 (*) 78.0 - 100.0 fL   MCH 23.3 (*)  26.0 - 34.0 pg   MCHC 31.4  30.0 - 36.0 g/dL   RDW 16.1 (*) 09.6 - 04.5 %   Platelets 258  150 - 400 K/uL   Neutrophils Relative 66  43 - 77 %   Neutro Abs 4.8  1.7 - 7.7 K/uL   Lymphocytes Relative 23  12 - 46 %   Lymphs Abs 1.7  0.7 - 4.0 K/uL   Monocytes Relative 9  3 - 12 %   Monocytes Absolute 0.7  0.1 - 1.0 K/uL   Eosinophils Relative 2  0 - 5 %   Eosinophils Absolute 0.1  0.0 - 0.7 K/uL   Basophils Relative 0  0 - 1 %   Basophils Absolute 0.0  0.0 - 0.1 K/uL  COMPREHENSIVE METABOLIC PANEL     Status: Abnormal   Collection Time   11/21/11 10:15 PM      Component Value Range   Sodium 136  135 - 145 mEq/L   Potassium 3.6  3.5 - 5.1 mEq/L   Chloride 104  96 - 112 mEq/L   CO2 22  19 - 32 mEq/L   Glucose, Bld 83  70 - 99 mg/dL   BUN 3 (*) 6 - 23 mg/dL   Creatinine, Ser 4.09 (*) 0.50 - 1.10 mg/dL   Calcium 9.4  8.4 - 81.1 mg/dL   Total Protein 6.8  6.0 - 8.3 g/dL   Albumin 2.9 (*) 3.5 - 5.2 g/dL   AST 18  0 -  37 U/L   ALT 10  0 - 35 U/L   Alkaline Phosphatase 174 (*) 39 - 117 U/L   Total Bilirubin 0.8  0.3 - 1.2 mg/dL   GFR calc non Af Amer >90  >90 mL/min   GFR calc Af Amer >90  >90 mL/min  URINALYSIS, ROUTINE W REFLEX MICROSCOPIC     Status: Abnormal   Collection Time   11/21/11 11:13 PM      Component Value Range   Color, Urine YELLOW  YELLOW   APPearance CLEAR  CLEAR   Specific Gravity, Urine 1.025  1.005 - 1.030   pH 6.5  5.0 - 8.0   Glucose, UA NEGATIVE  NEGATIVE mg/dL   Hgb urine dipstick NEGATIVE  NEGATIVE   Bilirubin Urine NEGATIVE  NEGATIVE   Ketones, ur 15 (*) NEGATIVE mg/dL   Protein, ur NEGATIVE  NEGATIVE mg/dL   Urobilinogen, UA 1.0  0.0 - 1.0 mg/dL   Nitrite NEGATIVE  NEGATIVE   Leukocytes, UA NEGATIVE  NEGATIVE   Labs essentially WNL--anemia already known.  D/C home with N/V precautions. Rx Zofran 4 mg ODT po q 8 hours prn. Stop Fe at present, due to intolerance. Keep scheduled appt at [facility] on 12/01/11, or call prn.  Nigel Bridgeman, CNM

## 2011-11-22 MED ORDER — ONDANSETRON HCL 4 MG/2ML IJ SOLN
4.0000 mg | Freq: Once | INTRAMUSCULAR | Status: AC
  Administered 2011-11-22: 4 mg via INTRAVENOUS
  Filled 2011-11-22: qty 2

## 2011-11-29 NOTE — Telephone Encounter (Signed)
Note to close enc. Lorenna Lurry, Jacqueline A  

## 2011-12-01 ENCOUNTER — Encounter: Payer: Medicaid Other | Admitting: Obstetrics and Gynecology

## 2011-12-03 ENCOUNTER — Encounter: Payer: Self-pay | Admitting: Obstetrics and Gynecology

## 2011-12-03 ENCOUNTER — Ambulatory Visit (INDEPENDENT_AMBULATORY_CARE_PROVIDER_SITE_OTHER): Payer: Medicaid Other | Admitting: Obstetrics and Gynecology

## 2011-12-03 VITALS — BP 120/76 | Wt 166.0 lb

## 2011-12-03 DIAGNOSIS — Z331 Pregnant state, incidental: Secondary | ICD-10-CM

## 2011-12-03 NOTE — Patient Instructions (Signed)
Fetal Movement Counts Patient Name: __________________________________________________ Patient Due Date: ____________________ Kick counts is highly recommended in high risk pregnancies, but it is a good idea for every pregnant woman to do. Start counting fetal movements at 28 weeks of the pregnancy. Fetal movements increase after eating a full meal or eating or drinking something sweet (the blood sugar is higher). It is also important to drink plenty of fluids (well hydrated) before doing the count. Lie on your left side because it helps with the circulation or you can sit in a comfortable chair with your arms over your belly (abdomen) with no distractions around you. DOING THE COUNT  Try to do the count the same time of day each time you do it.  Mark the day and time, then see how long it takes for you to feel 10 movements (kicks, flutters, swishes, rolls). You should have at least 10 movements within 2 hours. You will most likely feel 10 movements in much less than 2 hours. If you do not, wait an hour and count again. After a couple of days you will see a pattern.  What you are looking for is a change in the pattern or not enough counts in 2 hours. Is it taking longer in time to reach 10 movements? SEEK MEDICAL CARE IF:  You feel less than 10 counts in 2 hours. Tried twice.  No movement in one hour.  The pattern is changing or taking longer each day to reach 10 counts in 2 hours.  You feel the baby is not moving as it usually does. Date: ____________ Movements: ____________ Start time: ____________ Finish time: ____________  Date: ____________ Movements: ____________ Start time: ____________ Finish time: ____________ Date: ____________ Movements: ____________ Start time: ____________ Finish time: ____________ Date: ____________ Movements: ____________ Start time: ____________ Finish time: ____________ Date: ____________ Movements: ____________ Start time: ____________ Finish time:  ____________ Date: ____________ Movements: ____________ Start time: ____________ Finish time: ____________ Date: ____________ Movements: ____________ Start time: ____________ Finish time: ____________ Date: ____________ Movements: ____________ Start time: ____________ Finish time: ____________  Date: ____________ Movements: ____________ Start time: ____________ Finish time: ____________ Date: ____________ Movements: ____________ Start time: ____________ Finish time: ____________ Date: ____________ Movements: ____________ Start time: ____________ Finish time: ____________ Date: ____________ Movements: ____________ Start time: ____________ Finish time: ____________ Date: ____________ Movements: ____________ Start time: ____________ Finish time: ____________ Date: ____________ Movements: ____________ Start time: ____________ Finish time: ____________ Date: ____________ Movements: ____________ Start time: ____________ Finish time: ____________  Date: ____________ Movements: ____________ Start time: ____________ Finish time: ____________ Date: ____________ Movements: ____________ Start time: ____________ Finish time: ____________ Date: ____________ Movements: ____________ Start time: ____________ Finish time: ____________ Date: ____________ Movements: ____________ Start time: ____________ Finish time: ____________ Date: ____________ Movements: ____________ Start time: ____________ Finish time: ____________ Date: ____________ Movements: ____________ Start time: ____________ Finish time: ____________ Date: ____________ Movements: ____________ Start time: ____________ Finish time: ____________  Date: ____________ Movements: ____________ Start time: ____________ Finish time: ____________ Date: ____________ Movements: ____________ Start time: ____________ Finish time: ____________ Date: ____________ Movements: ____________ Start time: ____________ Finish time: ____________ Date: ____________ Movements:  ____________ Start time: ____________ Finish time: ____________ Date: ____________ Movements: ____________ Start time: ____________ Finish time: ____________ Date: ____________ Movements: ____________ Start time: ____________ Finish time: ____________ Date: ____________ Movements: ____________ Start time: ____________ Finish time: ____________  Date: ____________ Movements: ____________ Start time: ____________ Finish time: ____________ Date: ____________ Movements: ____________ Start time: ____________ Finish time: ____________ Date: ____________ Movements: ____________ Start time: ____________ Finish time: ____________ Date: ____________ Movements:   ____________ Start time: ____________ Finish time: ____________ Date: ____________ Movements: ____________ Start time: ____________ Finish time: ____________ Date: ____________ Movements: ____________ Start time: ____________ Finish time: ____________ Date: ____________ Movements: ____________ Start time: ____________ Finish time: ____________  Date: ____________ Movements: ____________ Start time: ____________ Finish time: ____________ Date: ____________ Movements: ____________ Start time: ____________ Finish time: ____________ Date: ____________ Movements: ____________ Start time: ____________ Finish time: ____________ Date: ____________ Movements: ____________ Start time: ____________ Finish time: ____________ Date: ____________ Movements: ____________ Start time: ____________ Finish time: ____________ Date: ____________ Movements: ____________ Start time: ____________ Finish time: ____________ Date: ____________ Movements: ____________ Start time: ____________ Finish time: ____________  Date: ____________ Movements: ____________ Start time: ____________ Finish time: ____________ Date: ____________ Movements: ____________ Start time: ____________ Finish time: ____________ Date: ____________ Movements: ____________ Start time: ____________ Finish  time: ____________ Date: ____________ Movements: ____________ Start time: ____________ Finish time: ____________ Date: ____________ Movements: ____________ Start time: ____________ Finish time: ____________ Date: ____________ Movements: ____________ Start time: ____________ Finish time: ____________ Date: ____________ Movements: ____________ Start time: ____________ Finish time: ____________  Date: ____________ Movements: ____________ Start time: ____________ Finish time: ____________ Date: ____________ Movements: ____________ Start time: ____________ Finish time: ____________ Date: ____________ Movements: ____________ Start time: ____________ Finish time: ____________ Date: ____________ Movements: ____________ Start time: ____________ Finish time: ____________ Date: ____________ Movements: ____________ Start time: ____________ Finish time: ____________ Date: ____________ Movements: ____________ Start time: ____________ Finish time: ____________ Document Released: 02/10/2006 Document Revised: 04/05/2011 Document Reviewed: 08/13/2008 [employer] Patient Information 2013 [employer].  

## 2011-12-03 NOTE — Progress Notes (Signed)
[redacted]w[redacted]d Pt can not tolerate iron.  She uses phenergan.  Told to try floridex.  She understands she may need a blood transfusion after delivery GBS @ [facility]

## 2011-12-03 NOTE — Progress Notes (Signed)
Pt c/o nausea and vomiting, had to stop taking iron.

## 2011-12-16 ENCOUNTER — Ambulatory Visit (INDEPENDENT_AMBULATORY_CARE_PROVIDER_SITE_OTHER): Payer: Medicaid Other | Admitting: Obstetrics and Gynecology

## 2011-12-16 VITALS — BP 120/68 | Wt 169.0 lb

## 2011-12-16 DIAGNOSIS — Z331 Pregnant state, incidental: Secondary | ICD-10-CM

## 2011-12-16 NOTE — Progress Notes (Signed)
[redacted]w[redacted]d Beta strep sent. Return to office in 1 week. Dr. Aviv Lengacher 

## 2011-12-16 NOTE — Progress Notes (Signed)
Pt stated she wanted to know if baby is still breech. Pt stated no other issues today.

## 2011-12-22 ENCOUNTER — Ambulatory Visit (INDEPENDENT_AMBULATORY_CARE_PROVIDER_SITE_OTHER): Payer: Medicaid Other | Admitting: Obstetrics and Gynecology

## 2011-12-22 VITALS — BP 120/64 | Wt 171.0 lb

## 2011-12-22 DIAGNOSIS — Z331 Pregnant state, incidental: Secondary | ICD-10-CM

## 2011-12-22 NOTE — Progress Notes (Signed)
[redacted]w[redacted]d Beta strep negative. Return to office in 1 week. Dr. Abagael Kramm  

## 2011-12-22 NOTE — Progress Notes (Signed)
[redacted]w[redacted]d No complaints today. 

## 2011-12-27 ENCOUNTER — Telehealth: Payer: Self-pay | Admitting: Obstetrics and Gynecology

## 2011-12-27 ENCOUNTER — Ambulatory Visit (INDEPENDENT_AMBULATORY_CARE_PROVIDER_SITE_OTHER): Payer: Medicaid Other | Admitting: Obstetrics and Gynecology

## 2011-12-27 ENCOUNTER — Encounter: Payer: Self-pay | Admitting: Obstetrics and Gynecology

## 2011-12-27 VITALS — BP 120/76 | Wt 171.0 lb

## 2011-12-27 DIAGNOSIS — O34219 Maternal care for unspecified type scar from previous cesarean delivery: Secondary | ICD-10-CM

## 2011-12-27 DIAGNOSIS — Z331 Pregnant state, incidental: Secondary | ICD-10-CM

## 2011-12-27 DIAGNOSIS — O3421 Maternal care for scar from previous cesarean delivery: Secondary | ICD-10-CM

## 2011-12-27 NOTE — Telephone Encounter (Signed)
Repeat C/S scheduled for 01/14/12 @ 12:45pm with SR/CHS. -Adrianne Pridgen

## 2011-12-27 NOTE — Progress Notes (Signed)
1 

## 2011-12-27 NOTE — Progress Notes (Signed)
[redacted]w[redacted]d Pt wants repeat c/s Consent signed today Will sched at 39wks FKCs and Labor Precautions RTO 1wk

## 2011-12-28 ENCOUNTER — Other Ambulatory Visit: Payer: Self-pay | Admitting: Obstetrics and Gynecology

## 2011-12-28 ENCOUNTER — Telehealth: Payer: Self-pay | Admitting: Obstetrics and Gynecology

## 2011-12-28 NOTE — Telephone Encounter (Signed)
Repeat C/S rescheduled to 01/13/12 @ 10:45am with AVS/CHS. -Adrianne Pridgen

## 2011-12-31 ENCOUNTER — Encounter (HOSPITAL_COMMUNITY): Payer: Self-pay

## 2012-01-03 ENCOUNTER — Encounter (HOSPITAL_COMMUNITY)
Admission: RE | Admit: 2012-01-03 | Discharge: 2012-01-03 | Disposition: A | Discharge status: Home / Self Care | Payer: Medicaid Other | Source: Ambulatory Visit | Attending: Obstetrics and Gynecology | Admitting: Obstetrics and Gynecology

## 2012-01-03 ENCOUNTER — Encounter (HOSPITAL_COMMUNITY): Payer: Self-pay

## 2012-01-03 VITALS — Ht 61.0 in | Wt 172.0 lb

## 2012-01-03 DIAGNOSIS — O26899 Other specified pregnancy related conditions, unspecified trimester: Secondary | ICD-10-CM

## 2012-01-03 DIAGNOSIS — O34219 Maternal care for unspecified type scar from previous cesarean delivery: Secondary | ICD-10-CM

## 2012-01-03 DIAGNOSIS — O219 Vomiting of pregnancy, unspecified: Secondary | ICD-10-CM

## 2012-01-03 HISTORY — DX: Other specified pregnancy related conditions, unspecified trimester: O26.899

## 2012-01-03 HISTORY — DX: Heartburn: R12

## 2012-01-03 LAB — CBC
HCT: 29.6 % — ABNORMAL LOW (ref 36.0–46.0)
MCH: 22.4 pg — ABNORMAL LOW (ref 26.0–34.0)
MCV: 72.7 fL — ABNORMAL LOW (ref 78.0–100.0)
Platelets: 214 10*3/uL (ref 150–400)
RDW: 19.2 % — ABNORMAL HIGH (ref 11.5–15.5)

## 2012-01-03 LAB — TYPE AND SCREEN
ABO/RH(D): O NEG
DAT, IgG: NEGATIVE

## 2012-01-03 NOTE — Patient Instructions (Addendum)
Your procedure is scheduled on:01/13/12  Enter through the Main Entrance at :0915 Pick up desk phone and dial [phone] and inform us of your arrival.  Please call [phone] if you have any problems the morning of surgery.  Remember: Do not eat or drink after midnight:WED   Take these meds the morning of surgery with a sip of water:none  DO NOT wear jewelry, eye make-up, lipstick,body lotion, or dark fingernail polish. Do not shave for 48 hours prior to surgery.  If you are to be admitted after surgery, leave suitcase in car until your room has been assigned.

## 2012-01-04 ENCOUNTER — Encounter: Payer: Medicaid Other | Admitting: Obstetrics and Gynecology

## 2012-01-04 ENCOUNTER — Encounter (HOSPITAL_COMMUNITY): Payer: Self-pay

## 2012-01-06 ENCOUNTER — Ambulatory Visit (INDEPENDENT_AMBULATORY_CARE_PROVIDER_SITE_OTHER): Payer: Medicaid Other | Admitting: Obstetrics and Gynecology

## 2012-01-06 ENCOUNTER — Encounter: Payer: Self-pay | Admitting: Obstetrics and Gynecology

## 2012-01-06 VITALS — BP 124/76 | Wt 174.0 lb

## 2012-01-06 DIAGNOSIS — Z331 Pregnant state, incidental: Secondary | ICD-10-CM

## 2012-01-06 NOTE — Progress Notes (Signed)
Pt requests cervix check today.  

## 2012-01-06 NOTE — Progress Notes (Signed)
[redacted]w[redacted]d A/P GBS negative Fetal kick counts reviewed Labor reviewed with pt All patients  questions answered Pt unsure if she is having ctxs.  Will do NST NST reactive with irregular contractions

## 2012-01-06 NOTE — Patient Instructions (Signed)
Cesarean Delivery  Cesarean delivery is the birth of a baby through a cut (incision) in the abdomen and womb (uterus).  LET YOUR CAREGIVER KNOW ABOUT:  Complicationsinvolving the pregnancy.  Allergies.  Medicines taken including herbs, eyedrops, over-the-counter medicines, and creams.  Use of steroids (by mouth or creams).  Previous problems with anesthetics or numbing medicine.  Previous surgery.  History of blood clots.  History of bleeding or blood problems.  Other health problems. RISKS AND COMPLICATIONS   Bleeding.  Infection.  Blood clots.  Injury to surrounding organs.  Anesthesia problems.  Injury to the baby. BEFORE THE PROCEDURE   A tube (Foley catheter) will be placed in your bladder. The Foley catheter drains the urine from your bladder into a bag. This keeps your bladder empty during surgery.  An intravenous access tube (IV) will be placed in your arm.  Hair may be removed from your pubic area and your lower abdomen. This is to prevent infection in the incision site.  You may be given an antacid medicine to drink. This will prevent acid contents in your stomach from going into your lungs if you vomit during the surgery.  You may be given an antibiotic medicine to prevent infection. PROCEDURE   You may be given medicine to numb the lower half of your body (regional anesthetic). If you were in labor, you may have already had an epidural in place which can be used in both labor and cesarean delivery. You may possibly be given medicine to make you sleep (general anesthetic) though this is not as common.  An incision will be made in your abdomen that extends to your uterus. There are 2 basic kinds of incisions:  The horizontal (transverse) incision. Horizontal incisions are used for most routine cesarean deliveries.  The vertical (up and down) incision. This is less commonly used. This is most often reserved for women who have a serious complication  (extreme prematurity) or under emergency situations.  The horizontal and vertical incisions may both be used at the same time. However, this is very uncommon.  Your baby will then be delivered. AFTER THE PROCEDURE   If you were awake during the surgery, you will see your baby right away. If you were asleep, you will see your baby as soon as you are awake.  You may breastfeed your baby after surgery.  You may be able to get up and walk the same day as the surgery. If you need to stay in bed for a period of time, you will receive help to turn, cough, and take deep breaths after surgery. This helps prevent lung problems such as pneumonia.  Do not get out of bed alone the first time after surgery. You will need help getting out of bed until you are able to do this by yourself.  You may be able to shower the day after your cesarean delivery. After the bandage (dressing) is taken off the incision site, a nurse will assist you to shower, if you like.  You will have pneumatic compressing hose placed on your feet or lower legs. These hose are used to prevent blood clots. When you are up and walking regularly, they will no longer be necessary.  Do not cross your legs when you sit.  Save any blood clots that you pass. If you pass a clot while on the toilet, do not flush it. Call for the nurse. Tell the nurse if you think you are bleeding too much or passing too many   clots.  Start drinking liquids and eating food as directed by your caregiver. If your stomach is not ready, drinking and eating too soon can cause an increase in bloating and swelling of your intestine and abdomen. This is very uncomfortable.  You will be given medicine as needed. Let your caregivers know if you are hurting. They want you to be comfortable. You may also be given an antibiotic to prevent an infection.  Your IV will be taken out when you are drinking a reasonable amount of fluids. The Foley catheter is taken out when  you are up and walking.  If your blood type is Rh negative and your baby's blood type is Rh positive, you will be given a shot of anti-D immune globulin. This shot prevents you from having Rh problems with a future pregnancy. You should get the shot even if you had your tubes tied (tubal ligation).  If you are allowed to take the baby for a walk, place the baby in the bassinet and push it. Do not carry your baby in your arms. Document Released: 01/11/2005 Document Revised: 04/05/2011 Document Reviewed: 05/08/2010 [employer] Patient Information 2013 [employer].  

## 2012-01-12 ENCOUNTER — Telehealth: Payer: Self-pay | Admitting: Obstetrics and Gynecology

## 2012-01-12 MED ORDER — GENTAMICIN SULFATE 40 MG/ML IJ SOLN
INTRAVENOUS | Status: AC
  Administered 2012-01-13: 100 mL via INTRAVENOUS
  Filled 2012-01-12: qty 7.15

## 2012-01-12 NOTE — H&P (Signed)
Admission History and Physical Exam for an Obstetrics Patient  Miranda Tanner is a 26 y.o. female, G2P1001, at 57w gestation, who presents for repeat cesarean section. She has been followed at the [facility] and Gynecology division of [facility].  Her pregnancy has been complicated by previous cesarean section, anemia, O- blood type with a positive antibody screen, and a urinary tract infection. See history below.  OB History    Grav Para Term Preterm Abortions TAB SAB Ect Mult Living   2 1 1       1       Past Medical History  Diagnosis Date  . Urinary tract infection   . Abnormal Pap smear 2004    ?LGSIL  with Cryo  . Heartburn in pregnancy   . Anemia     currently on iron    No prescriptions prior to admission    Past Surgical History  Procedure Date  . Cesarean section   . Tonsillectomy and adenoidectomy   . Wisdom tooth extraction   . Cryotherapy 2004    Allergies  Allergen Reactions  . Shellfish Allergy Anaphylaxis    PT IS ALLERGIC TO CRABMEAT  . Claritin-D 12 Hour (Loratadine-Pseudoephedrine Er) Hives  . Penicillins Swelling    Tongue swelling    Family History: family history includes Diabetes in her maternal grandfather and maternal grandmother and Stroke (age of onset:65) in her maternal grandmother.  Social History:  reports that she has never smoked. She has never used smokeless tobacco. She reports that she does not drink alcohol or use illicit drugs.  Review of systems: Normal pregnancy complaints.  Admission Physical Exam:    There is no height or weight on file to calculate BMI.  Last menstrual period 04/15/2011.  HEENT:                 Within normal limits Chest:                   Clear Heart:                    Regular rate and rhythm Abdomen:             Gravid and nontender Extremities:          Grossly normal Neurologic exam: Grossly normal Pelvic exam:         Cervix: closed and long when last  checked  Prenatal labs: ABO, Rh:             --/--/O NEG (12/09 1418) Antibody:              POS (12/09 1418) Rubella:                 immune RPR:                    NON REACTIVE (12/09 1411)  HBsAg:                 NEGATIVE (07/02 1110)  HIV:                       NON REACTIVE (07/02 1110)  GBS:                     NEGATIVE (11/21 1422)   Assessment:  [redacted] weeks gestation  Prior cesarean section  Desires repeat cesarean section  Anemia  O- blood type with a weakly positive  antibody screen  Plan:  Repeat cesarean section.  The patient understands the indications for surgical procedure and she accepts the risk of, but not limited to, anesthetic complications, bleeding, infections, and possible damage to the surrounding organs.   Miranda Tanner 01/12/2012, 7:33 PM

## 2012-01-13 ENCOUNTER — Inpatient Hospital Stay (HOSPITAL_COMMUNITY)
Admission: RE | Admit: 2012-01-13 | Discharge: 2012-01-15 | DRG: 766 | Disposition: A | Discharge status: Home / Self Care | Payer: Medicaid Other | Source: Ambulatory Visit | Attending: Obstetrics and Gynecology | Admitting: Obstetrics and Gynecology

## 2012-01-13 ENCOUNTER — Encounter (HOSPITAL_COMMUNITY): Payer: Self-pay | Admitting: Anesthesiology

## 2012-01-13 ENCOUNTER — Encounter (HOSPITAL_COMMUNITY)
Admission: RE | Disposition: A | Discharge status: Home / Self Care | Payer: Self-pay | Source: Ambulatory Visit | Attending: Obstetrics and Gynecology

## 2012-01-13 ENCOUNTER — Inpatient Hospital Stay (HOSPITAL_COMMUNITY): Payer: Medicaid Other | Admitting: Anesthesiology

## 2012-01-13 ENCOUNTER — Encounter (HOSPITAL_COMMUNITY): Payer: Self-pay | Admitting: *Deleted

## 2012-01-13 DIAGNOSIS — O34219 Maternal care for unspecified type scar from previous cesarean delivery: Secondary | ICD-10-CM

## 2012-01-13 DIAGNOSIS — D509 Iron deficiency anemia, unspecified: Secondary | ICD-10-CM | POA: Diagnosis present

## 2012-01-13 DIAGNOSIS — O9902 Anemia complicating childbirth: Secondary | ICD-10-CM | POA: Diagnosis present

## 2012-01-13 DIAGNOSIS — Z98891 History of uterine scar from previous surgery: Secondary | ICD-10-CM

## 2012-01-13 LAB — CBC
Hemoglobin: 9.3 g/dL — ABNORMAL LOW (ref 12.0–15.0)
MCV: 71.8 fL — ABNORMAL LOW (ref 78.0–100.0)
Platelets: 229 10*3/uL (ref 150–400)
RBC: 4.25 MIL/uL (ref 3.87–5.11)
WBC: 7.2 10*3/uL (ref 4.0–10.5)

## 2012-01-13 LAB — PREPARE RBC (CROSSMATCH)

## 2012-01-13 SURGERY — Surgical Case
Anesthesia: Spinal | Wound class: Clean Contaminated

## 2012-01-13 MED ORDER — LACTATED RINGERS IV SOLN
INTRAVENOUS | Status: DC | PRN
  Administered 2012-01-13 (×2): via INTRAVENOUS

## 2012-01-13 MED ORDER — BUPIVACAINE-EPINEPHRINE 0.5% -1:200000 IJ SOLN
INTRAMUSCULAR | Status: DC | PRN
  Administered 2012-01-13: 10 mL

## 2012-01-13 MED ORDER — KETOROLAC TROMETHAMINE 30 MG/ML IJ SOLN
30.0000 mg | Freq: Four times a day (QID) | INTRAMUSCULAR | Status: AC | PRN
  Administered 2012-01-13: 30 mg via INTRAVENOUS
  Filled 2012-01-13: qty 1

## 2012-01-13 MED ORDER — HYDROMORPHONE HCL PF 1 MG/ML IJ SOLN: 0.2500 mg | INTRAMUSCULAR | Status: DC | PRN

## 2012-01-13 MED ORDER — NALOXONE HCL 1 MG/ML IJ SOLN
1.0000 ug/kg/h | INTRAVENOUS | Status: DC | PRN
  Filled 2012-01-13: qty 2

## 2012-01-13 MED ORDER — MEDROXYPROGESTERONE ACETATE 150 MG/ML IM SUSP
150.0000 mg | INTRAMUSCULAR | Status: DC | PRN
  Filled 2012-01-13: qty 1

## 2012-01-13 MED ORDER — SCOPOLAMINE 1 MG/3DAYS TD PT72
1.0000 | MEDICATED_PATCH | Freq: Once | TRANSDERMAL | Status: DC
  Administered 2012-01-13: 1.5 mg via TRANSDERMAL

## 2012-01-13 MED ORDER — MENTHOL 3 MG MT LOZG: 1.0000 | LOZENGE | OROMUCOSAL | Status: DC | PRN

## 2012-01-13 MED ORDER — PRENATAL MULTIVITAMIN CH
1.0000 | ORAL_TABLET | Freq: Every morning | ORAL | Status: DC
  Administered 2012-01-14 – 2012-01-15 (×2): 1 via ORAL
  Filled 2012-01-13 (×2): qty 1

## 2012-01-13 MED ORDER — IBUPROFEN 600 MG PO TABS
600.0000 mg | ORAL_TABLET | Freq: Four times a day (QID) | ORAL | Status: DC
  Administered 2012-01-14 – 2012-01-15 (×7): 600 mg via ORAL
  Filled 2012-01-13 (×7): qty 1

## 2012-01-13 MED ORDER — ONDANSETRON HCL 4 MG PO TABS: 4.0000 mg | ORAL_TABLET | ORAL | Status: DC | PRN

## 2012-01-13 MED ORDER — SIMETHICONE 80 MG PO CHEW
80.0000 mg | CHEWABLE_TABLET | Freq: Three times a day (TID) | ORAL | Status: DC
  Administered 2012-01-13 – 2012-01-15 (×5): 80 mg via ORAL

## 2012-01-13 MED ORDER — MEASLES, MUMPS & RUBELLA VAC ~~LOC~~ INJ: 0.5000 mL | INJECTION | Freq: Once | SUBCUTANEOUS | Status: DC

## 2012-01-13 MED ORDER — FENTANYL CITRATE 0.05 MG/ML IJ SOLN
INTRAMUSCULAR | Status: DC | PRN
  Administered 2012-01-13: 12.5 ug via INTRAVENOUS

## 2012-01-13 MED ORDER — KETOROLAC TROMETHAMINE 60 MG/2ML IM SOLN
60.0000 mg | Freq: Once | INTRAMUSCULAR | Status: AC | PRN
  Filled 2012-01-13: qty 2

## 2012-01-13 MED ORDER — DIPHENHYDRAMINE HCL 50 MG/ML IJ SOLN: 25.0000 mg | INTRAMUSCULAR | Status: DC | PRN

## 2012-01-13 MED ORDER — PHENYLEPHRINE HCL 10 MG/ML IJ SOLN
INTRAMUSCULAR | Status: DC | PRN
  Administered 2012-01-13 (×2): 80 ug via INTRAVENOUS
  Administered 2012-01-13: 40 ug via INTRAVENOUS
  Administered 2012-01-13 (×2): 80 ug via INTRAVENOUS
  Administered 2012-01-13: 40 ug via INTRAVENOUS

## 2012-01-13 MED ORDER — METOCLOPRAMIDE HCL 10 MG PO TABS
10.0000 mg | ORAL_TABLET | Freq: Two times a day (BID) | ORAL | Status: DC
  Administered 2012-01-14: 10 mg via ORAL
  Filled 2012-01-13 (×2): qty 1

## 2012-01-13 MED ORDER — OXYTOCIN 10 UNIT/ML IJ SOLN
INTRAMUSCULAR | Status: AC
  Filled 2012-01-13: qty 4

## 2012-01-13 MED ORDER — WITCH HAZEL-GLYCERIN EX PADS: 1.0000 "application " | MEDICATED_PAD | CUTANEOUS | Status: DC | PRN

## 2012-01-13 MED ORDER — SENNOSIDES-DOCUSATE SODIUM 8.6-50 MG PO TABS
2.0000 | ORAL_TABLET | Freq: Every day | ORAL | Status: DC
  Administered 2012-01-13 – 2012-01-14 (×2): 2 via ORAL

## 2012-01-13 MED ORDER — METOCLOPRAMIDE HCL 5 MG/ML IJ SOLN: 10.0000 mg | Freq: Three times a day (TID) | INTRAMUSCULAR | Status: DC | PRN

## 2012-01-13 MED ORDER — PROMETHAZINE HCL 25 MG/ML IJ SOLN: 6.2500 mg | INTRAMUSCULAR | Status: DC | PRN

## 2012-01-13 MED ORDER — LACTATED RINGERS IV SOLN
INTRAVENOUS | Status: DC | PRN
  Administered 2012-01-13 (×4): via INTRAVENOUS

## 2012-01-13 MED ORDER — MORPHINE SULFATE (PF) 0.5 MG/ML IJ SOLN
INTRAMUSCULAR | Status: DC | PRN
  Administered 2012-01-13: .2 mg via INTRATHECAL

## 2012-01-13 MED ORDER — MEPERIDINE HCL 25 MG/ML IJ SOLN: 6.2500 mg | INTRAMUSCULAR | Status: DC | PRN

## 2012-01-13 MED ORDER — LANOLIN HYDROUS EX OINT: 1.0000 "application " | TOPICAL_OINTMENT | CUTANEOUS | Status: DC | PRN

## 2012-01-13 MED ORDER — DIPHENHYDRAMINE HCL 50 MG/ML IJ SOLN: 12.5000 mg | INTRAMUSCULAR | Status: DC | PRN

## 2012-01-13 MED ORDER — SIMETHICONE 80 MG PO CHEW: 80.0000 mg | CHEWABLE_TABLET | ORAL | Status: DC | PRN

## 2012-01-13 MED ORDER — OXYCODONE-ACETAMINOPHEN 5-325 MG PO TABS
1.0000 | ORAL_TABLET | ORAL | Status: DC | PRN
  Administered 2012-01-14 (×4): 1 via ORAL
  Administered 2012-01-15: 2 via ORAL
  Administered 2012-01-15 (×2): 1 via ORAL
  Filled 2012-01-13 (×3): qty 1
  Filled 2012-01-13: qty 2
  Filled 2012-01-13: qty 1
  Filled 2012-01-13: qty 2
  Filled 2012-01-13: qty 1

## 2012-01-13 MED ORDER — LACTATED RINGERS IV SOLN
INTRAVENOUS | Status: DC
  Administered 2012-01-13 – 2012-01-14 (×2): via INTRAVENOUS

## 2012-01-13 MED ORDER — ONDANSETRON 4 MG PO TBDP
4.0000 mg | ORAL_TABLET | Freq: Three times a day (TID) | ORAL | Status: DC | PRN
  Filled 2012-01-13: qty 1

## 2012-01-13 MED ORDER — ONDANSETRON HCL 4 MG/2ML IJ SOLN
INTRAMUSCULAR | Status: AC
  Filled 2012-01-13: qty 2

## 2012-01-13 MED ORDER — KETOROLAC TROMETHAMINE 30 MG/ML IJ SOLN: 30.0000 mg | Freq: Four times a day (QID) | INTRAMUSCULAR | Status: AC | PRN

## 2012-01-13 MED ORDER — ONDANSETRON HCL 4 MG/2ML IJ SOLN: 4.0000 mg | INTRAMUSCULAR | Status: DC | PRN

## 2012-01-13 MED ORDER — NALOXONE HCL 0.4 MG/ML IJ SOLN: 0.4000 mg | INTRAMUSCULAR | Status: DC | PRN

## 2012-01-13 MED ORDER — ZOLPIDEM TARTRATE 5 MG PO TABS: 5.0000 mg | ORAL_TABLET | Freq: Every evening | ORAL | Status: DC | PRN

## 2012-01-13 MED ORDER — KETOROLAC TROMETHAMINE 60 MG/2ML IM SOLN
INTRAMUSCULAR | Status: AC
  Administered 2012-01-13: 60 mg via INTRAMUSCULAR
  Filled 2012-01-13: qty 2

## 2012-01-13 MED ORDER — SODIUM CHLORIDE 0.9 % IJ SOLN: 3.0000 mL | INTRAMUSCULAR | Status: DC | PRN

## 2012-01-13 MED ORDER — BUPIVACAINE-EPINEPHRINE (PF) 0.5% -1:200000 IJ SOLN
INTRAMUSCULAR | Status: AC
  Filled 2012-01-13: qty 10

## 2012-01-13 MED ORDER — NALBUPHINE SYRINGE 5 MG/0.5 ML
INJECTION | INTRAMUSCULAR | Status: AC
  Administered 2012-01-13: 10 mg via INTRAVENOUS
  Filled 2012-01-13: qty 0.5

## 2012-01-13 MED ORDER — ONDANSETRON HCL 4 MG/2ML IJ SOLN: 4.0000 mg | Freq: Three times a day (TID) | INTRAMUSCULAR | Status: DC | PRN

## 2012-01-13 MED ORDER — OXYTOCIN 10 UNIT/ML IJ SOLN
40.0000 [IU] | INTRAVENOUS | Status: DC | PRN
  Administered 2012-01-13: 40 [IU] via INTRAVENOUS

## 2012-01-13 MED ORDER — DIPHENHYDRAMINE HCL 25 MG PO CAPS
25.0000 mg | ORAL_CAPSULE | ORAL | Status: DC | PRN
  Filled 2012-01-13: qty 1

## 2012-01-13 MED ORDER — LACTATED RINGERS IV SOLN
INTRAVENOUS | Status: DC
  Administered 2012-01-13: 10:00:00 via INTRAVENOUS

## 2012-01-13 MED ORDER — MORPHINE SULFATE 0.5 MG/ML IJ SOLN
INTRAMUSCULAR | Status: AC
  Filled 2012-01-13: qty 10

## 2012-01-13 MED ORDER — FENTANYL CITRATE 0.05 MG/ML IJ SOLN
INTRAMUSCULAR | Status: AC
  Filled 2012-01-13: qty 2

## 2012-01-13 MED ORDER — DIBUCAINE 1 % RE OINT: 1.0000 "application " | TOPICAL_OINTMENT | RECTAL | Status: DC | PRN

## 2012-01-13 MED ORDER — NALBUPHINE HCL 10 MG/ML IJ SOLN
5.0000 mg | INTRAMUSCULAR | Status: DC | PRN
  Administered 2012-01-13: 10 mg via SUBCUTANEOUS
  Filled 2012-01-13: qty 1

## 2012-01-13 MED ORDER — TETANUS-DIPHTH-ACELL PERTUSSIS 5-2.5-18.5 LF-MCG/0.5 IM SUSP: 0.5000 mL | Freq: Once | INTRAMUSCULAR | Status: DC

## 2012-01-13 MED ORDER — NALBUPHINE HCL 10 MG/ML IJ SOLN
5.0000 mg | INTRAMUSCULAR | Status: DC | PRN
  Administered 2012-01-13 – 2012-01-14 (×2): 10 mg via INTRAVENOUS
  Filled 2012-01-13 (×3): qty 1

## 2012-01-13 MED ORDER — OXYTOCIN 40 UNITS IN LACTATED RINGERS INFUSION - SIMPLE MED: 62.5000 mL/h | INTRAVENOUS | Status: AC

## 2012-01-13 MED ORDER — EPHEDRINE SULFATE 50 MG/ML IJ SOLN
INTRAMUSCULAR | Status: DC | PRN
  Administered 2012-01-13 (×2): 5 mg via INTRAVENOUS
  Administered 2012-01-13: 10 mg via INTRAVENOUS

## 2012-01-13 MED ORDER — SCOPOLAMINE 1 MG/3DAYS TD PT72
MEDICATED_PATCH | TRANSDERMAL | Status: AC
  Administered 2012-01-13: 1.5 mg via TRANSDERMAL
  Filled 2012-01-13: qty 1

## 2012-01-13 MED ORDER — DIPHENHYDRAMINE HCL 25 MG PO CAPS: 25.0000 mg | ORAL_CAPSULE | Freq: Four times a day (QID) | ORAL | Status: DC | PRN

## 2012-01-13 MED ORDER — PRENATAL MULTIVITAMIN CH: 1.0000 | ORAL_TABLET | Freq: Every day | ORAL | Status: DC

## 2012-01-13 MED ORDER — KETOROLAC TROMETHAMINE 30 MG/ML IJ SOLN: 15.0000 mg | Freq: Once | INTRAMUSCULAR | Status: DC | PRN

## 2012-01-13 MED ORDER — BUPIVACAINE IN DEXTROSE 0.75-8.25 % IT SOLN
INTRATHECAL | Status: DC | PRN
  Administered 2012-01-13: 1.4 mL via INTRATHECAL

## 2012-01-13 MED ORDER — PHENYLEPHRINE 40 MCG/ML (10ML) SYRINGE FOR IV PUSH (FOR BLOOD PRESSURE SUPPORT)
PREFILLED_SYRINGE | INTRAVENOUS | Status: AC
  Filled 2012-01-13: qty 5

## 2012-01-13 MED ORDER — NALBUPHINE SYRINGE 5 MG/0.5 ML
INJECTION | INTRAMUSCULAR | Status: AC
  Administered 2012-01-13: 10 mg via SUBCUTANEOUS
  Filled 2012-01-13: qty 0.5

## 2012-01-13 MED ORDER — 0.9 % SODIUM CHLORIDE (POUR BTL) OPTIME
TOPICAL | Status: DC | PRN
  Administered 2012-01-13: 1000 mL

## 2012-01-13 SURGICAL SUPPLY — 43 items
CLOTH BEACON ORANGE TIMEOUT ST (SAFETY) ×2 IMPLANT
CONTAINER PREFILL 10% NBF 15ML (MISCELLANEOUS) IMPLANT
DRAIN JACKSON PRT FLT 7MM (DRAIN) IMPLANT
DRAPE LG THREE QUARTER DISP (DRAPES) ×2 IMPLANT
DRESSING TELFA 8X3 (GAUZE/BANDAGES/DRESSINGS) ×2 IMPLANT
DRSG OPSITE POSTOP 4X10 (GAUZE/BANDAGES/DRESSINGS) IMPLANT
DURAPREP 26ML APPLICATOR (WOUND CARE) ×2 IMPLANT
ELECT REM PT RETURN 9FT ADLT (ELECTROSURGICAL) ×2
ELECTRODE REM PT RTRN 9FT ADLT (ELECTROSURGICAL) ×1 IMPLANT
EVACUATOR SILICONE 100CC (DRAIN) IMPLANT
EXTRACTOR VACUUM M CUP 4 TUBE (SUCTIONS) IMPLANT
GAUZE SPONGE 4X4 12PLY STRL LF (GAUZE/BANDAGES/DRESSINGS) ×4 IMPLANT
GLOVE BIOGEL PI IND STRL 8.5 (GLOVE) ×1 IMPLANT
GLOVE BIOGEL PI INDICATOR 8.5 (GLOVE) ×1
GLOVE ECLIPSE 8.0 STRL XLNG CF (GLOVE) ×4 IMPLANT
GOWN PREVENTION PLUS LG XLONG (DISPOSABLE) ×4 IMPLANT
GOWN PREVENTION PLUS XXLARGE (GOWN DISPOSABLE) ×2 IMPLANT
KIT ABG SYR 3ML LUER SLIP (SYRINGE) IMPLANT
NDL HYPO 25X1 1.5 SAFETY (NEEDLE) ×1 IMPLANT
NDL HYPO 25X5/8 SAFETYGLIDE (NEEDLE) IMPLANT
NEEDLE HYPO 25X1 1.5 SAFETY (NEEDLE) ×2 IMPLANT
NEEDLE HYPO 25X5/8 SAFETYGLIDE (NEEDLE) IMPLANT
PACK C SECTION WH (CUSTOM PROCEDURE TRAY) ×2 IMPLANT
PAD ABD 7.5X8 STRL (GAUZE/BANDAGES/DRESSINGS) ×2 IMPLANT
PAD OB MATERNITY 4.3X12.25 (PERSONAL CARE ITEMS) IMPLANT
RINGERS IRRIG 1000ML POUR BTL (IV SOLUTION) ×2 IMPLANT
SLEEVE SCD COMPRESS KNEE MED (MISCELLANEOUS) IMPLANT
SPONGE GAUZE 4X4 12PLY (GAUZE/BANDAGES/DRESSINGS) ×1 IMPLANT
STAPLER VISISTAT 35W (STAPLE) IMPLANT
SUT MNCRL AB 3-0 PS2 27 (SUTURE) IMPLANT
SUT PLAIN 0 NONE (SUTURE) IMPLANT
SUT SILK 3 0 FS 1X18 (SUTURE) IMPLANT
SUT VIC AB 0 CT1 27 (SUTURE) ×4
SUT VIC AB 0 CT1 27XBRD ANBCTR (SUTURE) ×2 IMPLANT
SUT VIC AB 2-0 CTX 36 (SUTURE) ×4 IMPLANT
SUT VIC AB 3-0 CT1 27 (SUTURE)
SUT VIC AB 3-0 CT1 TAPERPNT 27 (SUTURE) IMPLANT
SUT VIC AB 3-0 SH 27 (SUTURE)
SUT VIC AB 3-0 SH 27X BRD (SUTURE) IMPLANT
SYR CONTROL 10ML LL (SYRINGE) ×2 IMPLANT
TOWEL OR 17X24 6PK STRL BLUE (TOWEL DISPOSABLE) ×6 IMPLANT
TRAY FOLEY CATH 14FR (SET/KITS/TRAYS/PACK) ×2 IMPLANT
WATER STERILE IRR 1000ML POUR (IV SOLUTION) ×2 IMPLANT

## 2012-01-13 NOTE — Anesthesia Postprocedure Evaluation (Signed)
  Anesthesia Post-op Note  Patient: Miranda Tanner  Procedure(s) Performed: Procedure(s) (LRB) with comments: CESAREAN SECTION (N/A) - Repeat  Patient Location: Mother/Baby  Anesthesia Type:Spinal  Level of Consciousness: awake, alert  and oriented  Airway and Oxygen Therapy: Patient Spontanous Breathing  Post-op Pain: mild  Post-op Assessment: Post-op Vital signs reviewed, Patient's Cardiovascular Status Stable, No headache, No backache, No residual numbness and No residual motor weakness  Post-op Vital Signs: Reviewed and stable  Complications: No apparent anesthesia complications

## 2012-01-13 NOTE — Addendum Note (Signed)
Addendum  created 01/13/12 2209 by Graciela Husbands, CRNA   Modules edited:Notes Section

## 2012-01-13 NOTE — Op Note (Signed)
OPERATIVE NOTE  Patient's Name: Miranda Tanner  Date of Birth: 08-23-1985   Medical Records Number: [id number]   Date of Operation: 01/13/2012   Preoperative diagnosis:  [redacted]w[redacted]d weeks gestation  Prior cesarean section  Desires Repeat Cesarean Section  Anemia  Penicillin Allergy  Sickle Cell Trait  Positive Antibody Screen  Postoperative diagnosis:  Same  Procedure:  Repeat low transverse cesarean section  Surgeon:  Leonard Schwartz, M.D.  Assistant:  Denny Levy, certified nurse midwife  Anesthesia:  Regional  Disposition:  KHYLEI WILMS is a 26 y.o. female, [redacted]w[redacted]d, who presents at [redacted]w[redacted]d weeks gestation. The patient has been followed at the [facility] obstetrics and gynecology division of [facility] health care for women. This pregnancy has been complicated by a prior cesarean section. The patient desires a repeat cesarean section. She understands the indications for her procedure and she accepts the risk of, but not limited to, anesthetic complications, bleeding, infections, and possible damage to the surrounding organs.  Findings:  A  female Brooke Dare) was delivered from a OT position.  The Apgar scores were 9/9. The uterus, fallopian tubes, and ovaries were normal for the gravid state.  Procedure:  The patient was taken to the operating room where a spinal anesthetic was given.The perineum was prepped with non-iodine prep solution. A Foley catheter was placed in the bladder.The patient's abdomen was prepped with Duraprep.   The patient was sterilely draped. The lower abdomen was injected with half percent Marcaine with epinephrine. A low transverse incision was made in the abdomen and carried sharply through the subcutaneous tissue, the fascia, and the anterior peritoneum. An incision was made in the lower uterine segment. The incision was extended in a low transverse fashion. The membranes were ruptured. The fetal head was delivered without  difficulty. The mouth and nose were suctioned. The remainder of the infant was then delivered. The cord was clamped and cut. The infant was handed to the awaiting pediatric team. The placenta was removed. The uterine cavity was cleaned of amniotic fluid, clotted blood, and membranes. The uterine incision was closed using a running locking suture of 2-0 Vicryl. An imbricating suture of 2-0 Vicryl was placed. The pelvis was vigorously irrigated. Hemostasis was adequate. The anterior peritoneum and the abdominal musculature were closed using 2-0 Vicryl. The fascia was closed using a running suture of 0 Vicryl followed by 3 interrupted sutures of 0 Vicryl. The subcutaneous layer was closed using interrupted sutures of 2-0 Vicryl. The skin was reapproximated using a subcuticular suture of 3-0 Monocryl. Sponge, needle, and instrument counts were correct on 2 occasions. The estimated blood loss for the procedure was 900 cc. The patient tolerated her procedure well. She was transported to the recovery room in stable condition. The infant remained in the operating room with the mother for bonding. The placenta was sent to labor and delivery.  Leonard Schwartz, M.D.

## 2012-01-13 NOTE — Telephone Encounter (Signed)
Pt called about CS.  AVS

## 2012-01-13 NOTE — Transfer of Care (Signed)
Immediate Anesthesia Transfer of Care Note  Patient: Miranda Tanner  Procedure(s) Performed: Procedure(s) (LRB) with comments: CESAREAN SECTION (N/A) - Repeat  Patient Location: PACU  Anesthesia Type:Spinal  Level of Consciousness: awake, alert , oriented and patient cooperative  Airway & Oxygen Therapy: Patient Spontanous Breathing  Post-op Assessment: Report given to PACU RN and Post -op Vital signs reviewed and stable  Post vital signs: Reviewed and stable  Complications: No apparent anesthesia complications

## 2012-01-13 NOTE — Anesthesia Postprocedure Evaluation (Signed)
Anesthesia Post Note  Patient: Miranda Tanner  Procedure(s) Performed: Procedure(s) (LRB): CESAREAN SECTION (N/A)  Anesthesia type: Spinal  Patient location: PACU  Post pain: Pain level controlled  Post assessment: Post-op Vital signs reviewed  Last Vitals:  Filed Vitals:   01/13/12 0911  BP: 129/86  Pulse: 107  Temp: 36.8 C  Resp: 20    Post vital signs: Reviewed  Level of consciousness: awake  Complications: No apparent anesthesia complications

## 2012-01-13 NOTE — Preoperative (Signed)
Beta Blockers   Reason not to administer Beta Blockers:Not Applicable 

## 2012-01-13 NOTE — Anesthesia Preprocedure Evaluation (Addendum)
Anesthesia Evaluation  Patient identified by MRN, date of birth, ID band Patient awake    Reviewed: Allergy & Precautions, H&P , NPO status , Patient's Chart, lab work & pertinent test results  Airway Mallampati: I TM Distance: >3 FB Neck ROM: full    Dental No notable dental hx.    Pulmonary neg pulmonary ROS,  breath sounds clear to auscultation  Pulmonary exam normal       Cardiovascular negative cardio ROS  Rhythm:regular Rate:Normal     Neuro/Psych negative neurological ROS  negative psych ROS   GI/Hepatic negative GI ROS, Neg liver ROS,   Endo/Other  negative endocrine ROS  Renal/GU negative Renal ROS  negative genitourinary   Musculoskeletal negative musculoskeletal ROS (+)   Abdominal Normal abdominal exam  (+)   Peds negative pediatric ROS (+)  Hematology negative hematology ROS (+)   Anesthesia Other Findings   Reproductive/Obstetrics (+) Pregnancy                           Anesthesia Physical Anesthesia Plan  ASA: II  Anesthesia Plan: Spinal   Post-op Pain Management:    Induction:   Airway Management Planned:   Additional Equipment:   Intra-op Plan:   Post-operative Plan:   Informed Consent: I have reviewed the patients History and Physical, chart, labs and discussed the procedure including the risks, benefits and alternatives for the proposed anesthesia with the patient or authorized representative who has indicated his/her understanding and acceptance.     Plan Discussed with: CRNA and Surgeon  Anesthesia Plan Comments:         Anesthesia Quick Evaluation

## 2012-01-13 NOTE — Consult Note (Signed)
Neonatology Note:  Attendance at C-section:  I was asked to attend this repeat C/S at term. The mother is a G2P1 O neg, GBS neg with an uncomplicated pregnancy. ROM at delivery, fluid clear. Infant vigorous with good spontaneous cry and tone. Needed only bulb suctioning. Ap 9/9. Lungs clear to ausc in DR. To CN to care of Pediatrician.  Liala Codispoti, MD  

## 2012-01-13 NOTE — H&P (Signed)
The patient was interviewed and examined today.  The previously documented history and physical examination was reviewed. There are no changes. The operative procedure was reviewed. The risks and benefits were outlined again. The specific risks include, but are not limited to, anesthetic complications, bleeding, infections, and possible damage to the surrounding organs. The patient's questions were answered.  We are ready to proceed as outlined. The likelihood of the patient achieving the goals of this procedure is very likely.   BP 129/86  Pulse 107  Temp 98.2 F (36.8 C) (Oral)  Resp 20  SpO2 100%  LMP 04/15/2011  CBC    Component Value Date/Time   WBC 7.2 01/13/2012 0915   RBC 4.25 01/13/2012 0915   HGB 9.3* 01/13/2012 0915   HCT 30.5* 01/13/2012 0915   PLT 229 01/13/2012 0915   MCV 71.8* 01/13/2012 0915   MCH 21.9* 01/13/2012 0915   MCHC 30.5 01/13/2012 0915   RDW 19.1* 01/13/2012 0915   LYMPHSABS 1.7 11/21/2011 2215   MONOABS 0.7 11/21/2011 2215   EOSABS 0.1 11/21/2011 2215   BASOSABS 0.0 11/21/2011 2215    Leonard Schwartz, M.D.

## 2012-01-13 NOTE — Anesthesia Procedure Notes (Signed)
Spinal  Patient location during procedure: OR Start time: 01/13/2012 11:06 AM End time: 01/13/2012 11:09 AM Staffing Anesthesiologist: Sandrea Hughs Performed by: anesthesiologist  Preanesthetic Checklist Completed: patient identified, site marked, surgical consent, pre-op evaluation, timeout performed, IV checked, risks and benefits discussed and monitors and equipment checked Spinal Block Patient position: sitting Prep: DuraPrep Patient monitoring: heart rate, cardiac monitor, continuous pulse ox and blood pressure Approach: midline Location: L3-4 Injection technique: single-shot Needle Needle type: Sprotte  Needle gauge: 24 G Needle length: 9 cm Needle insertion depth: 6 cm Assessment Sensory level: T4

## 2012-01-14 ENCOUNTER — Encounter (HOSPITAL_COMMUNITY): Payer: Self-pay | Admitting: Obstetrics and Gynecology

## 2012-01-14 LAB — CBC
MCH: 22 pg — ABNORMAL LOW (ref 26.0–34.0)
MCHC: 30.4 g/dL (ref 30.0–36.0)
MCV: 72.4 fL — ABNORMAL LOW (ref 78.0–100.0)
Platelets: 225 10*3/uL (ref 150–400)

## 2012-01-14 LAB — BIRTH TISSUE RECOVERY COLLECTION (PLACENTA DONATION)

## 2012-01-14 MED ORDER — POLYSACCHARIDE IRON COMPLEX 150 MG PO CAPS
150.0000 mg | ORAL_CAPSULE | Freq: Two times a day (BID) | ORAL | Status: DC
  Administered 2012-01-14: 150 mg via ORAL
  Filled 2012-01-14 (×3): qty 1

## 2012-01-14 MED ORDER — RHO D IMMUNE GLOBULIN 1500 UNIT/2ML IJ SOLN
300.0000 ug | Freq: Once | INTRAMUSCULAR | Status: AC
  Administered 2012-01-14: 300 ug via INTRAMUSCULAR
  Filled 2012-01-14: qty 2

## 2012-01-14 NOTE — Progress Notes (Signed)
UR completed 

## 2012-01-14 NOTE — Progress Notes (Signed)
Subjective: Postpartum Day 1: Cesarean Delivery Patient reports tolerating PO, + flatus and no problems voiding.  Pt declines nu-iron, and has Floridex in hospital w/ her for anemia.  Showered and up today w/o dizziness.  Desires early d/c tomorrow.  Desires inpt circ.  Lactation going well; didn't BF older child.  Reports "almost no" VB.  Objective: Vital signs in last 24 hours: Temp:  [97.6 F (36.4 C)-98.5 F (36.9 C)] 98.5 F (36.9 C) (12/20 1402) Pulse Rate:  [94-108] 108  (12/20 1402) Resp:  [18-20] 18  (12/20 1402) BP: (109-120)/(69-76) 109/70 mmHg (12/20 1402) SpO2:  [94 %-97 %] 97 % (12/20 1110) .Marland Kitchen Filed Vitals:   01/14/12 1110 01/14/12 1114 01/14/12 1118 01/14/12 1402  BP: 112/72 115/76 112/74 109/70  Pulse: 108 102 106 108  Temp: 98.5 F (36.9 C)   98.5 F (36.9 C)  TempSrc:    Oral  Resp: 20   18  Weight:      SpO2: 97%      Physical Exam:  General: alert, cooperative and no distress Lochia: appropriate Uterine Fundus: firm, below umbilicus Incision: healing well, honeycomb dsg c/d/i DVT Evaluation: No evidence of DVT seen on physical exam. Negative Homan's sign. No significant calf/ankle edema.   Basename 01/14/12 0545 01/13/12 0915  HGB 7.9* 9.3*  HCT 26.0* 30.5*    Assessment/Plan: Status post repeat Cesarean section. Postoperative course complicated by anemia  Continue current POC.  Floridex 2tsp po bid.  Orthostatic VS WNL today.  Lactating.  Chryl Holten H 01/14/2012, 7:26 PM

## 2012-01-15 LAB — RH IG WORKUP (INCLUDES ABO/RH): Unit division: 0

## 2012-01-15 MED ORDER — OXYCODONE-ACETAMINOPHEN 5-325 MG PO TABS: 1.0000 | ORAL_TABLET | ORAL | Status: DC | PRN

## 2012-01-15 MED ORDER — IBUPROFEN 600 MG PO TABS: 600.0000 mg | ORAL_TABLET | Freq: Four times a day (QID) | ORAL | Status: DC | PRN

## 2012-01-15 MED ORDER — MEDROXYPROGESTERONE ACETATE 150 MG/ML IM SUSP
150.0000 mg | Freq: Once | INTRAMUSCULAR | Status: AC
  Administered 2012-01-15: 150 mg via INTRAMUSCULAR

## 2012-01-15 NOTE — Discharge Summary (Signed)
  Cesarean Section Delivery Discharge Summary  Miranda Tanner  DOB:    1985/03/17 MRN:    [id number] CSN:    [id number]  Date of admission:                  01/13/12  Date of discharge:                   12/27/11 Procedures this admission:  Repeat LTCS   Newborn Data:  Live born female  Birth Weight: 7 lb 10.6 oz (3475 g) APGAR: 9, 9  Home with mother. Name: ?  History of Present Illness:  Miranda Tanner is a 26 y.o. female, G2P2002, who presents at [redacted]w[redacted]d weeks gestation. The patient has been followed at the [facility] and Gynecology division of [facility] [facility].    Her pregnancy has been complicated by:  Patient Active Problem List  Diagnosis  . ANEMIA, IRON DEFICIENCY  . Rh negative state in antepartum period  . Previous cesarean delivery affecting pregnancy  . Urinary tract infection  . Anemia  . Abnormal Pap smear  . Pregnant state, incidental  . Nausea and vomiting in pregnancy  . Status post repeat low transverse cesarean section    Hospital course:  The patient was admitted for repeat C/S on 12/189/13.   Her postpartum course was not complicated, although she did have Hgb of 7.9, with pre-delivery Hgb of 9.3, although she was asymptomatic and declined transfusion. She was discharged early to home on postpartum day 2 doing well.  Circumcision was done in the hospital, since patient had made arrangements for that.  Feeding:  breast  Contraception:  Depo-Provera on d/c, then Nexplanon after 6 weeks  Discharge hemoglobin:  Hemoglobin  Date Value Range Status  01/14/2012 7.9* 12.0 - 15.0 g/dL Final     HCT  Date Value Range Status  01/14/2012 26.0* 36.0 - 46.0 % Final    Discharge Physical Exam:   General: alert Lochia: appropriate Uterine Fundus: firm Incision: healing well,  Honeycomb dressing CDI DVT Evaluation: No evidence of DVT seen on physical exam. Negative Homan's sign.  Intrapartum Procedures:  cesarean: low cervical, transverse Postpartum Procedures: Rho(D) Ig and Depo Provera at d/c Complications-Operative and Postpartum: Anemia without hemodynamic instability  Discharge Diagnoses: Term Pregnancy-delivered and Previous C/S, desired repeat, anemia  Discharge Information:  Activity:           Per [facility] handout Diet:                routine Medications: Ibuprofen and Percocet Condition:      stable Instructions:  refer to practice specific booklet Discharge to: home  Follow-up Information    Follow up with [facility]. Schedule an appointment as soon as possible for a visit in 6 weeks. (Call with any issues or questions)    Contact information:   [address] [place] [address] [phone]          Nigel Bridgeman 01/15/2012

## 2012-01-17 ENCOUNTER — Encounter: Payer: Self-pay | Admitting: Obstetrics and Gynecology

## 2012-01-17 LAB — TYPE AND SCREEN
ABO/RH(D): O NEG
Unit division: 0

## 2012-01-20 ENCOUNTER — Encounter (HOSPITAL_COMMUNITY)
Admission: RE | Admit: 2012-01-20 | Discharge: 2012-01-20 | Disposition: A | Discharge status: Home / Self Care | Payer: Self-pay | Source: Ambulatory Visit | Attending: Obstetrics and Gynecology | Admitting: Obstetrics and Gynecology

## 2012-01-20 DIAGNOSIS — O923 Agalactia: Secondary | ICD-10-CM | POA: Insufficient documentation

## 2012-02-08 ENCOUNTER — Telehealth: Payer: Self-pay | Admitting: Obstetrics and Gynecology

## 2012-02-08 NOTE — Telephone Encounter (Signed)
Tc to pt per telephone call. Pt wants to d/c breastfeeding due to returning back to work. Informed pt to use cabbage leaves around the clock, avoid breast stimulation, and wear a sports bra at all times,even with sleeping. Pt voices understanding.

## 2012-02-20 ENCOUNTER — Encounter (HOSPITAL_COMMUNITY)
Admission: RE | Admit: 2012-02-20 | Discharge: 2012-02-20 | Disposition: A | Discharge status: Home / Self Care | Payer: Self-pay | Source: Ambulatory Visit | Attending: Obstetrics and Gynecology | Admitting: Obstetrics and Gynecology

## 2012-02-20 DIAGNOSIS — O923 Agalactia: Secondary | ICD-10-CM | POA: Insufficient documentation

## 2012-02-24 ENCOUNTER — Encounter: Payer: Self-pay | Admitting: Obstetrics and Gynecology

## 2012-02-24 ENCOUNTER — Ambulatory Visit: Payer: Medicaid Other | Admitting: Obstetrics and Gynecology

## 2012-02-24 NOTE — Progress Notes (Signed)
[facility] POST PARTUM OFFICE VISIT  Today's Date: 02/24/2012  Ms. Miranda Tanner is a 27 y.o. year old female,G2P2002, who presents for a postpartum visit. She had a repeat cesarean section on January 13, 2012 of a healthy female infant named Brooke Dare.   Subjective:  Doing well at this time.  Normal GI and GU function.  Objective:  BP 100/62  Ht 5\' 1"  (1.549 m)  Wt 150 lb (68.04 kg)  BMI 28.34 kg/m2  Breastfeeding? No   General: no distress GI: incision: clean, dry and intact and soft and nontender  External genitalia: normal general appearance Vaginal: normal without tenderness, induration or masses Cervix: normal appearance Adnexa: normal bimanual exam Uterus: nontender, normal size  Assessment:  Doing well status post cesarean section  Plan:    1. Contraception: Depo-Provera injections. 2. EPDS: 0. 3. Follow up in: 7 months or as needed. 4. Breast Feeding: Yes. Just stopped this week so that she can return to work. 5. Return to work, exercising, and a normal activities: yes.  Leonard Schwartz M.D.  02/24/2012 6:26 PM    Date of delivery: 01/13/2012 Female Name: Brooke Dare  Vaginal delivery:no Cesarean section:yes Tubal ligation:no GDM:no Breast Feeding:no Bottle Feeding:yes Post-Partum Blues:no Abnormal pap:yes Normal GU function: yes Normal GI function:yes Returning to work:yes EPDS: 0

## 2012-02-25 ENCOUNTER — Other Ambulatory Visit: Payer: Self-pay

## 2012-03-22 ENCOUNTER — Encounter (HOSPITAL_COMMUNITY)
Admission: RE | Admit: 2012-03-22 | Discharge: 2012-03-22 | Disposition: A | Discharge status: Home / Self Care | Payer: Self-pay | Source: Ambulatory Visit | Attending: Obstetrics and Gynecology | Admitting: Obstetrics and Gynecology

## 2012-03-22 DIAGNOSIS — O923 Agalactia: Secondary | ICD-10-CM | POA: Insufficient documentation

## 2012-03-27 ENCOUNTER — Telehealth: Payer: Self-pay | Admitting: Obstetrics and Gynecology

## 2012-03-27 ENCOUNTER — Other Ambulatory Visit: Payer: Self-pay | Admitting: Obstetrics and Gynecology

## 2012-03-27 MED ORDER — MEDROXYPROGESTERONE ACETATE 150 MG/ML IM SUSP: 150.0000 mg | Freq: Once | INTRAMUSCULAR | Status: DC

## 2012-03-27 NOTE — Telephone Encounter (Signed)
Tc to pt regarding msg, was seen about a month ago after she delivered on 01/13/12.  Pt states she was told by AVS that he would call in a rx for her and she is due soon for another injection.  Per pt chart by AVS she is to have the Depo-Provera injecton.  E-prescribed pt's Depo-Provera to her pharmacy of choice.  Pt scheduled for next injection on Monday 04/03/12 @ 1100 in the lab.  Pt voices agreement.

## 2012-04-03 ENCOUNTER — Other Ambulatory Visit: Payer: Medicaid Other

## 2012-04-20 ENCOUNTER — Encounter (HOSPITAL_COMMUNITY)
Admission: RE | Admit: 2012-04-20 | Discharge: 2012-04-20 | Disposition: A | Discharge status: Home / Self Care | Payer: Self-pay | Source: Ambulatory Visit | Attending: Obstetrics and Gynecology | Admitting: Obstetrics and Gynecology

## 2012-04-20 DIAGNOSIS — O923 Agalactia: Secondary | ICD-10-CM | POA: Insufficient documentation

## 2012-05-21 ENCOUNTER — Encounter (HOSPITAL_COMMUNITY)
Admission: RE | Admit: 2012-05-21 | Discharge: 2012-05-21 | Disposition: A | Discharge status: Home / Self Care | Payer: Self-pay | Source: Ambulatory Visit | Attending: Obstetrics and Gynecology | Admitting: Obstetrics and Gynecology

## 2012-05-21 DIAGNOSIS — O923 Agalactia: Secondary | ICD-10-CM | POA: Insufficient documentation

## 2012-06-21 ENCOUNTER — Encounter (HOSPITAL_COMMUNITY)
Admission: RE | Admit: 2012-06-21 | Discharge: 2012-06-21 | Disposition: A | Discharge status: Home / Self Care | Payer: Self-pay | Source: Ambulatory Visit | Attending: Obstetrics and Gynecology | Admitting: Obstetrics and Gynecology

## 2012-06-21 DIAGNOSIS — O923 Agalactia: Secondary | ICD-10-CM | POA: Insufficient documentation

## 2012-07-22 ENCOUNTER — Encounter (HOSPITAL_COMMUNITY)
Admission: RE | Admit: 2012-07-22 | Discharge: 2012-07-22 | Disposition: A | Discharge status: Home / Self Care | Payer: Self-pay | Source: Ambulatory Visit | Attending: Obstetrics and Gynecology | Admitting: Obstetrics and Gynecology

## 2012-07-22 DIAGNOSIS — O923 Agalactia: Secondary | ICD-10-CM | POA: Insufficient documentation

## 2012-08-21 ENCOUNTER — Encounter (HOSPITAL_COMMUNITY)
Admission: RE | Admit: 2012-08-21 | Discharge: 2012-08-21 | Disposition: A | Discharge status: Home / Self Care | Payer: Self-pay | Source: Ambulatory Visit | Attending: Obstetrics and Gynecology | Admitting: Obstetrics and Gynecology

## 2012-08-21 DIAGNOSIS — O923 Agalactia: Secondary | ICD-10-CM | POA: Insufficient documentation

## 2012-09-21 ENCOUNTER — Encounter (HOSPITAL_COMMUNITY)
Admission: RE | Admit: 2012-09-21 | Discharge: 2012-09-21 | Disposition: A | Discharge status: Home / Self Care | Payer: Self-pay | Source: Ambulatory Visit | Attending: Obstetrics and Gynecology | Admitting: Obstetrics and Gynecology

## 2012-09-21 DIAGNOSIS — O923 Agalactia: Secondary | ICD-10-CM | POA: Insufficient documentation

## 2012-10-22 ENCOUNTER — Encounter (HOSPITAL_COMMUNITY)
Admission: RE | Admit: 2012-10-22 | Discharge: 2012-10-22 | Disposition: A | Discharge status: Home / Self Care | Payer: Self-pay | Source: Ambulatory Visit | Attending: Obstetrics and Gynecology | Admitting: Obstetrics and Gynecology

## 2012-10-22 DIAGNOSIS — O923 Agalactia: Secondary | ICD-10-CM | POA: Insufficient documentation

## 2012-11-22 ENCOUNTER — Encounter (HOSPITAL_COMMUNITY)
Admission: RE | Admit: 2012-11-22 | Discharge: 2012-11-22 | Disposition: A | Discharge status: Home / Self Care | Payer: Self-pay | Source: Ambulatory Visit | Attending: Obstetrics and Gynecology | Admitting: Obstetrics and Gynecology

## 2012-11-22 DIAGNOSIS — O923 Agalactia: Secondary | ICD-10-CM | POA: Insufficient documentation

## 2012-12-23 ENCOUNTER — Encounter (HOSPITAL_COMMUNITY)
Admission: RE | Admit: 2012-12-23 | Discharge: 2012-12-23 | Disposition: A | Discharge status: Home / Self Care | Payer: Self-pay | Source: Ambulatory Visit | Attending: Obstetrics and Gynecology | Admitting: Obstetrics and Gynecology

## 2012-12-23 DIAGNOSIS — O923 Agalactia: Secondary | ICD-10-CM | POA: Insufficient documentation

## 2013-01-23 ENCOUNTER — Encounter (HOSPITAL_COMMUNITY)
Admission: RE | Admit: 2013-01-23 | Discharge: 2013-01-23 | Disposition: A | Discharge status: Home / Self Care | Payer: Self-pay | Source: Ambulatory Visit | Attending: Obstetrics and Gynecology | Admitting: Obstetrics and Gynecology

## 2013-01-23 DIAGNOSIS — O923 Agalactia: Secondary | ICD-10-CM | POA: Insufficient documentation

## 2013-02-23 ENCOUNTER — Encounter (HOSPITAL_COMMUNITY)
Admission: RE | Admit: 2013-02-23 | Discharge: 2013-02-23 | Disposition: A | Discharge status: Home / Self Care | Payer: Self-pay | Source: Ambulatory Visit | Attending: Obstetrics and Gynecology | Admitting: Obstetrics and Gynecology

## 2013-02-23 DIAGNOSIS — O923 Agalactia: Secondary | ICD-10-CM | POA: Insufficient documentation

## 2013-09-02 IMAGING — US US OB TRANSVAGINAL
1 series · 14 of 28 positions shown · non-contrast
Comparison: None.

CLINICAL DATA: Anemia, lightheaded, pregnant. Pain.

OBSTETRIC <14 WK US AND TRANSVAGINAL OB US
TECHNIQUE: Both transabdominal and transvaginal ultrasound
examinations were performed for complete evaluation of the
gestation as well as the maternal uterus, adnexal regions, and
pelvic cul-de-sac.  Transvaginal technique was performed to assess
early pregnancy.

[Series 1: us ob comp less 14 wks · 14 of 42 slices shown]
[im 2/42]
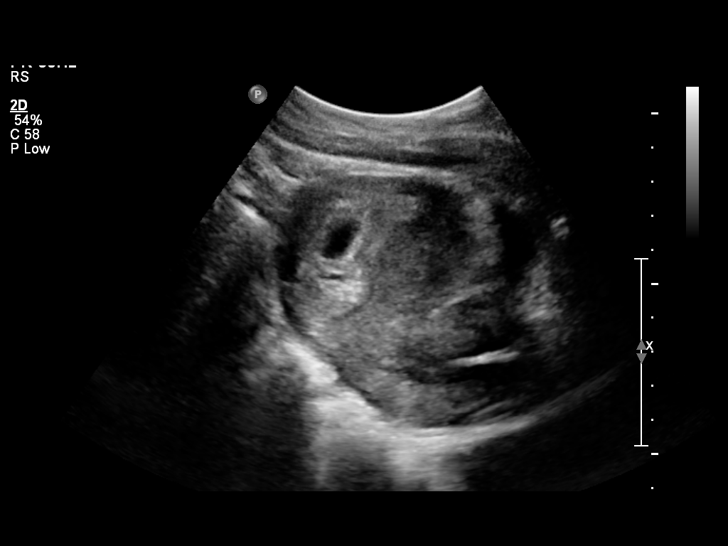
[im 5/42]
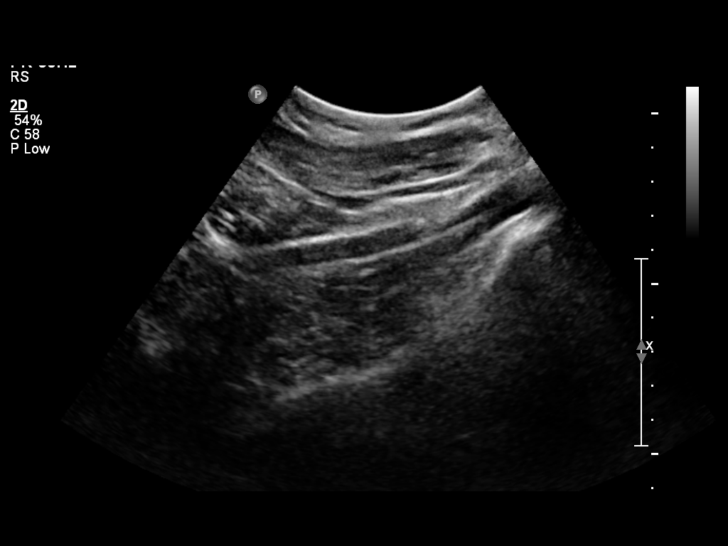
[im 8/42]
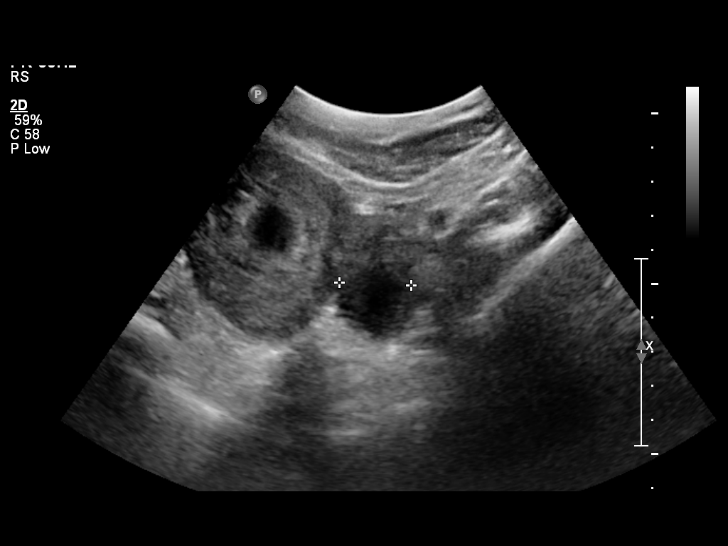
[im 11/42]
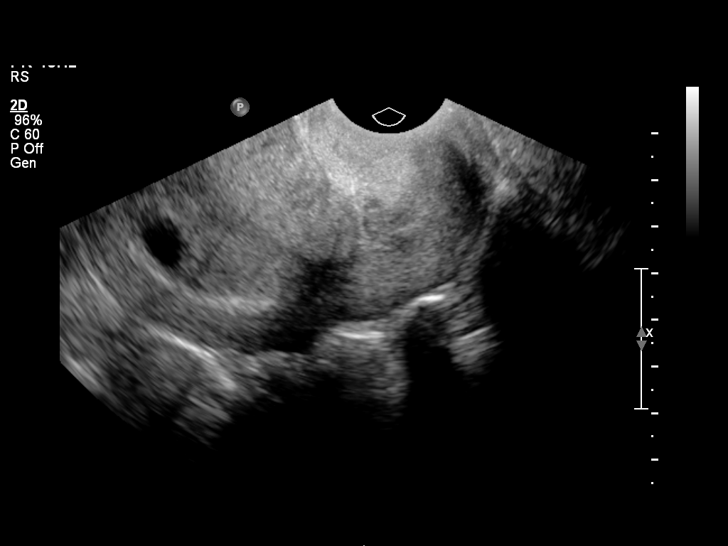
[im 14/42]
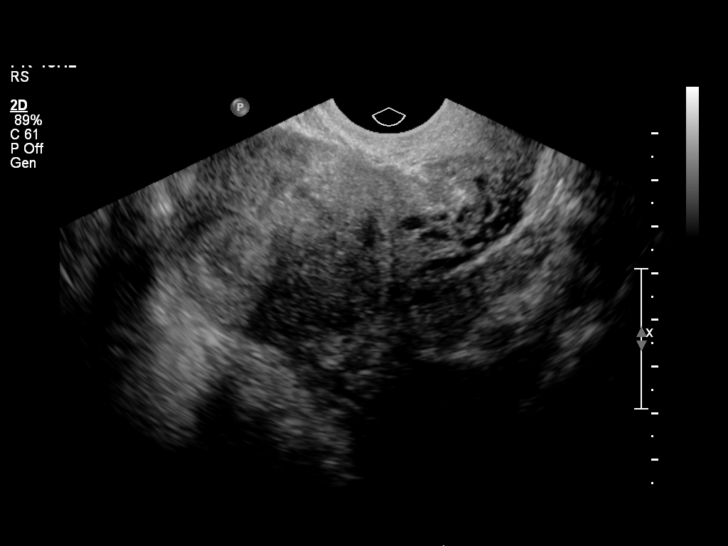
[im 17/42]
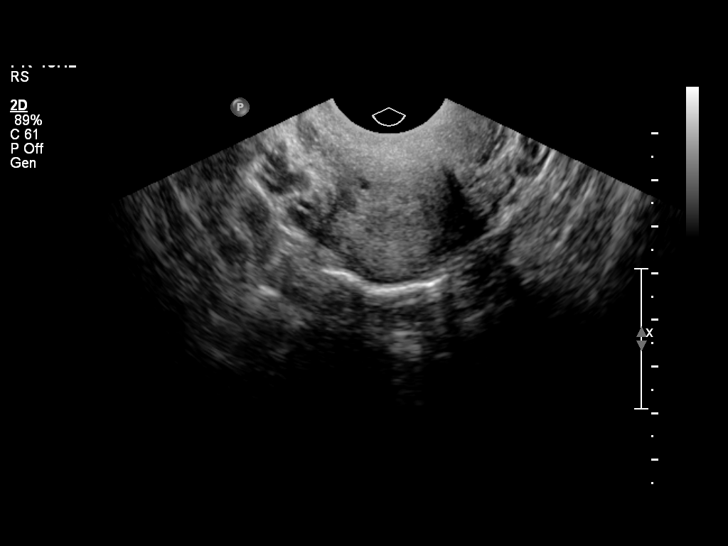
[im 20/42]
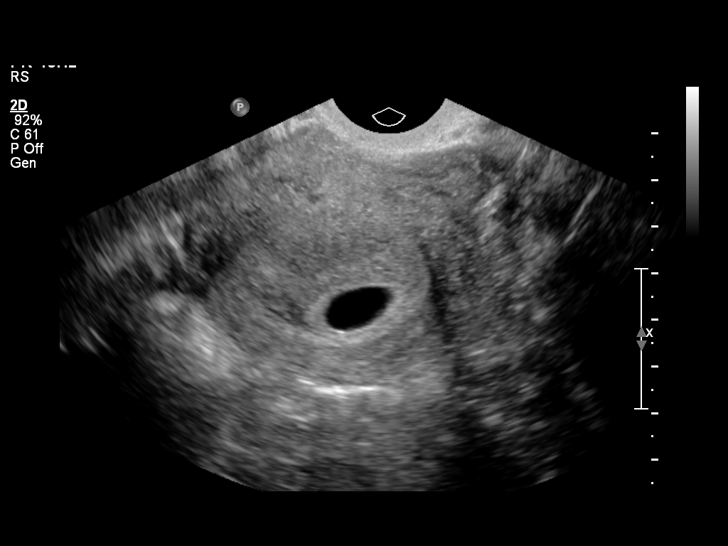
[im 23/42]
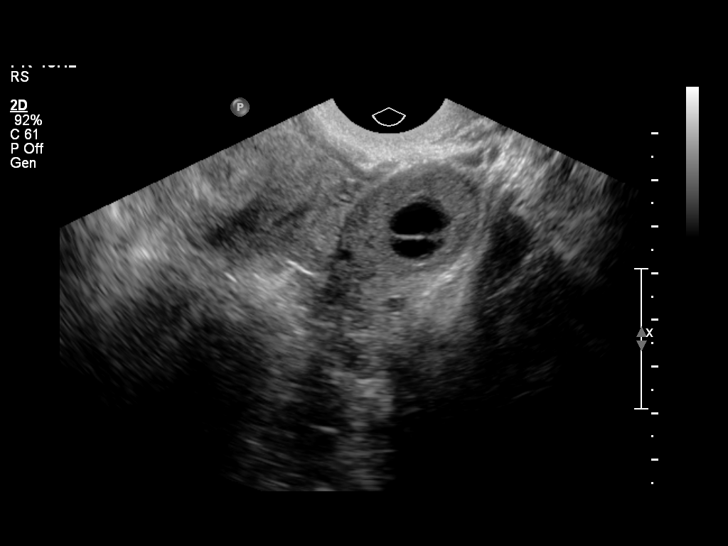
[im 26/42]
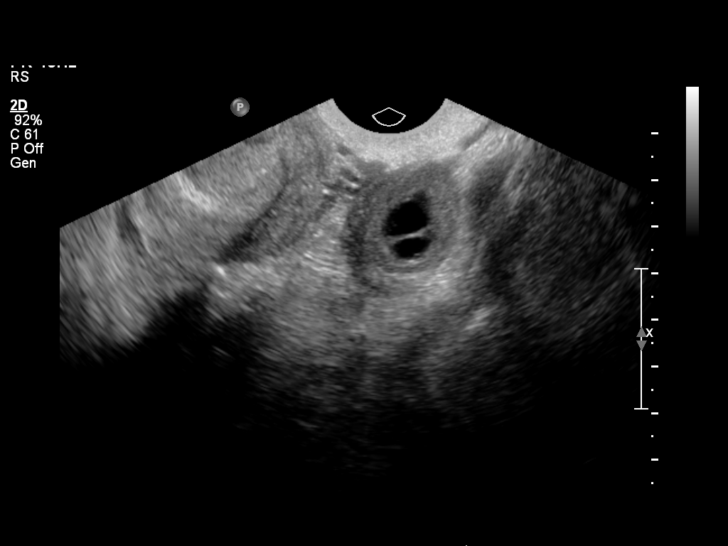
[im 29/42]
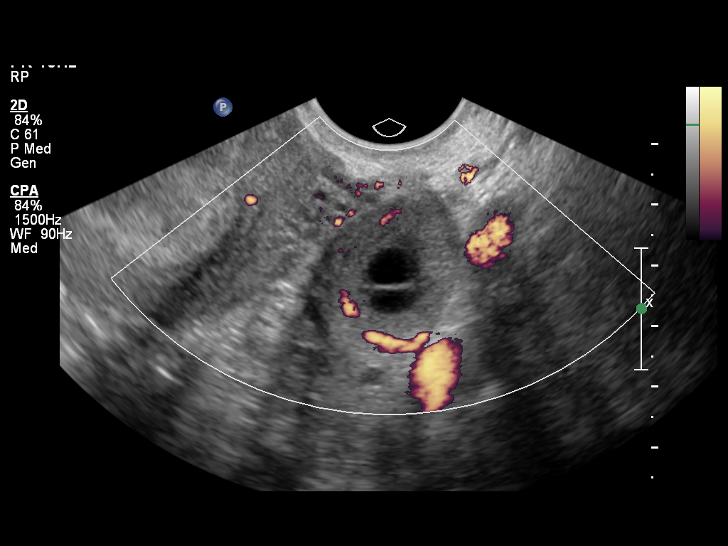
[im 32/42]
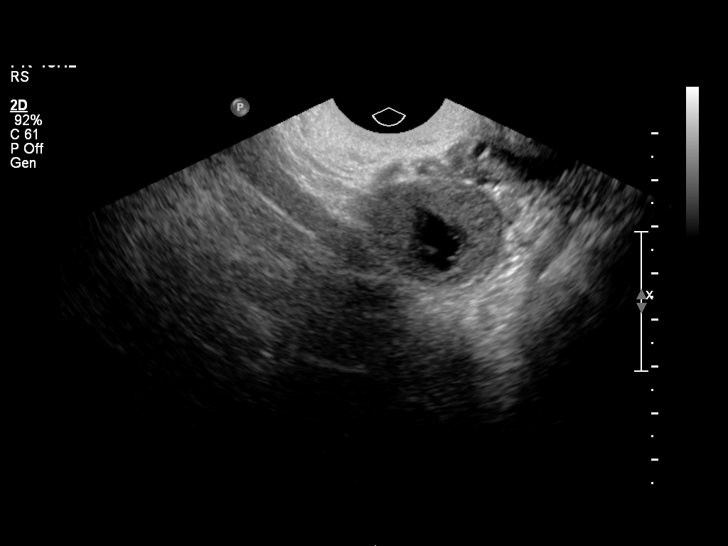
[im 35/42]
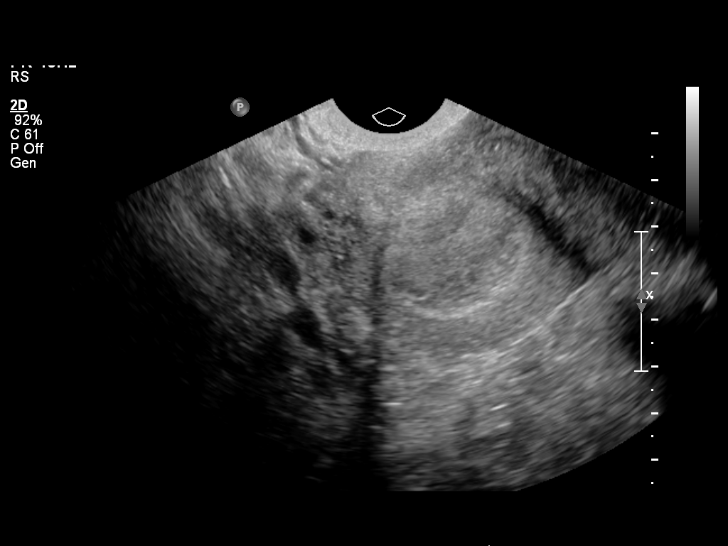
[im 38/42]
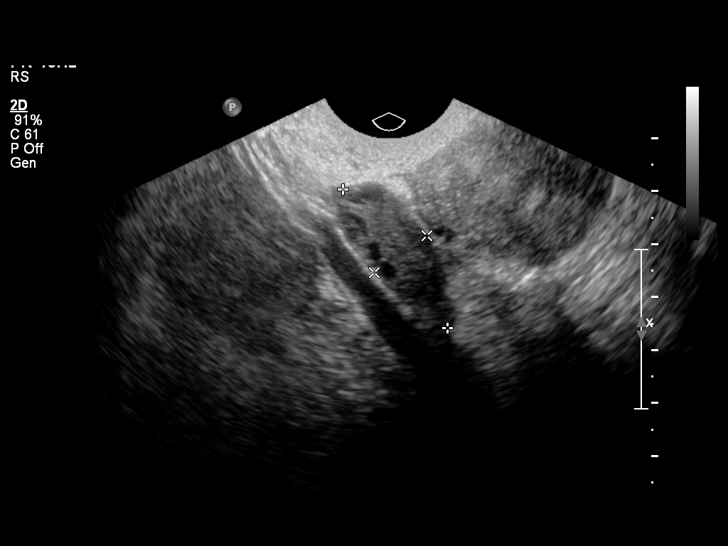
[im 42/42]
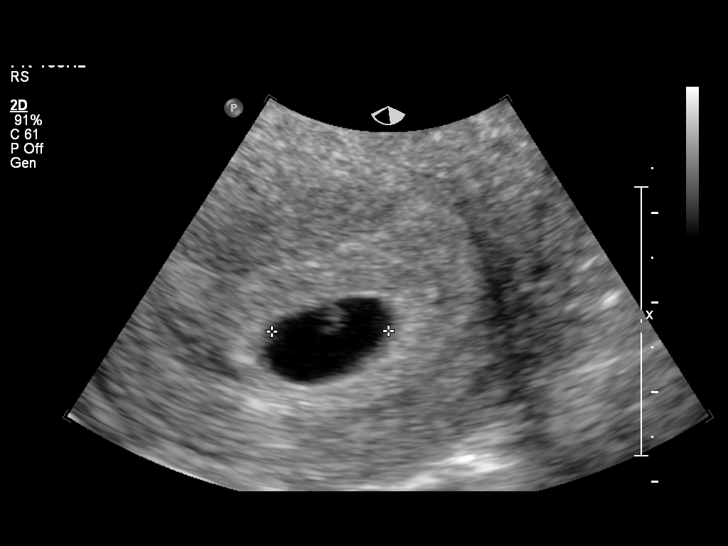

[14 of 28 positions shown; findings below may reference images not displayed]

Intrauterine gestational sac:  Visualized/normal in shape.
Yolk sac: Identified
Embryo: Not identified
Cardiac Activity: Not applicable

MSD: 12.3  mm  6    w 0    d
US EDC: 01/16/2012

Maternal uterus/adnexae:
No free fluid.  Normal sonographic appearance to the ovaries.
Corpus luteal cyst noted on the left.
IMPRESSION: Single intrauterine gestational sac identified with estimated age
of 6 weeks 0 days by mean sac diameter.

## 2013-11-26 ENCOUNTER — Encounter: Payer: Self-pay | Admitting: Obstetrics and Gynecology

## 2015-11-06 ENCOUNTER — Encounter (HOSPITAL_COMMUNITY): Payer: Self-pay | Admitting: Emergency Medicine

## 2015-11-06 ENCOUNTER — Emergency Department (HOSPITAL_COMMUNITY)
Admission: EM | Admit: 2015-11-06 | Discharge: 2015-11-06 | Disposition: A | Discharge status: Home / Self Care | Payer: No Typology Code available for payment source | Attending: Emergency Medicine | Admitting: Emergency Medicine

## 2015-11-06 ENCOUNTER — Emergency Department (HOSPITAL_COMMUNITY): Payer: No Typology Code available for payment source

## 2015-11-06 DIAGNOSIS — M549 Dorsalgia, unspecified: Secondary | ICD-10-CM | POA: Insufficient documentation

## 2015-11-06 DIAGNOSIS — T148XXA Other injury of unspecified body region, initial encounter: Secondary | ICD-10-CM

## 2015-11-06 DIAGNOSIS — S46912A Strain of unspecified muscle, fascia and tendon at shoulder and upper arm level, left arm, initial encounter: Secondary | ICD-10-CM | POA: Diagnosis not present

## 2015-11-06 DIAGNOSIS — M542 Cervicalgia: Secondary | ICD-10-CM | POA: Insufficient documentation

## 2015-11-06 DIAGNOSIS — Y92410 Unspecified street and highway as the place of occurrence of the external cause: Secondary | ICD-10-CM | POA: Insufficient documentation

## 2015-11-06 DIAGNOSIS — Y939 Activity, unspecified: Secondary | ICD-10-CM | POA: Diagnosis not present

## 2015-11-06 DIAGNOSIS — S4992XA Unspecified injury of left shoulder and upper arm, initial encounter: Secondary | ICD-10-CM | POA: Diagnosis present

## 2015-11-06 DIAGNOSIS — Y999 Unspecified external cause status: Secondary | ICD-10-CM | POA: Insufficient documentation

## 2015-11-06 MED ORDER — IBUPROFEN 600 MG PO TABS: 600.0000 mg | ORAL_TABLET | Freq: Four times a day (QID) | ORAL | 0 refills | Status: DC | PRN

## 2015-11-06 MED ORDER — METHOCARBAMOL 500 MG PO TABS: 500.0000 mg | ORAL_TABLET | Freq: Two times a day (BID) | ORAL | 0 refills | Status: DC | PRN

## 2015-11-06 MED ORDER — IBUPROFEN 200 MG PO TABS
600.0000 mg | ORAL_TABLET | Freq: Once | ORAL | Status: AC
  Administered 2015-11-06: 600 mg via ORAL
  Filled 2015-11-06: qty 3

## 2015-11-06 NOTE — ED Notes (Signed)
Bed: [facility] Expected date:  Expected time:  Means of arrival:  Comments: 
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
## 2015-11-06 NOTE — Progress Notes (Signed)
Pt confirms her pcp is At [facility]  Family practice
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
## 2016-05-26 ENCOUNTER — Ambulatory Visit
Admission: RE | Admit: 2016-05-26 | Discharge: 2016-05-26 | Disposition: A | Discharge status: Home / Self Care | Payer: Medicaid Other | Source: Ambulatory Visit | Attending: Family Medicine | Admitting: Family Medicine

## 2016-05-26 ENCOUNTER — Other Ambulatory Visit: Payer: Self-pay | Admitting: Family Medicine

## 2016-05-26 DIAGNOSIS — N939 Abnormal uterine and vaginal bleeding, unspecified: Secondary | ICD-10-CM

## 2017-06-27 IMAGING — US US PELVIS COMPLETE
1 series · 14 of 25 positions shown · non-contrast
Comparison: Ultrasound pelvis 05/23/2011.

CLINICAL DATA: Heavy bleeding with menses for 1 year.

EXAM:
TRANSABDOMINAL AND TRANSVAGINAL ULTRASOUND OF PELVIS
TECHNIQUE: Both transabdominal and transvaginal ultrasound examinations of the
pelvis were performed. Transabdominal technique was performed for
global imaging of the pelvis including uterus, ovaries, adnexal
regions, and pelvic cul-de-sac. It was necessary to proceed with
endovaginal exam following the transabdominal exam to visualize the
uterus and ovaries.

[Series 1: us pelvis complete · 0.20mm/px · 14 of 47 slices shown]
[im 1/47]
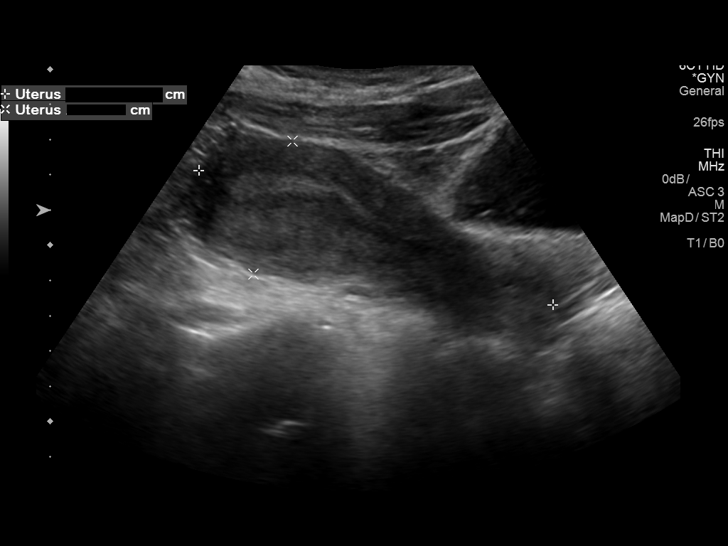
[im 4/47]
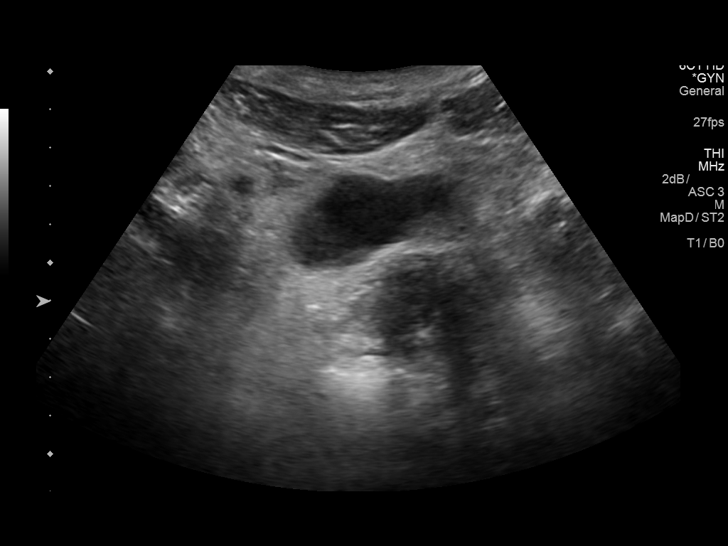
[im 8/47]
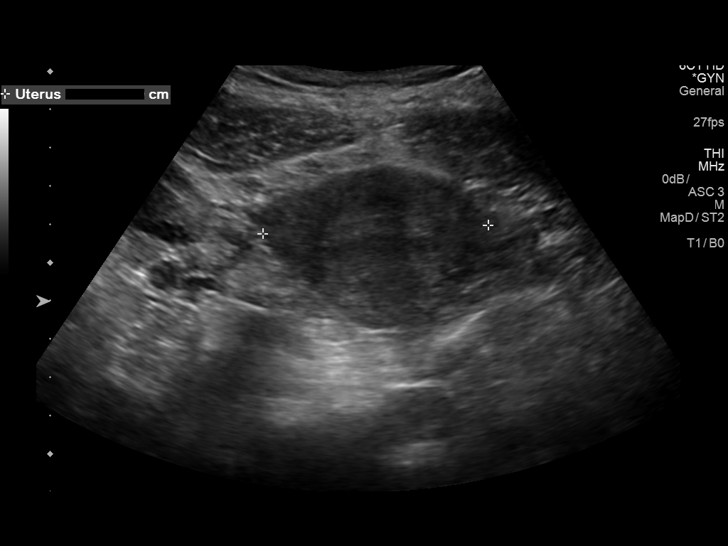
[im 12/47]
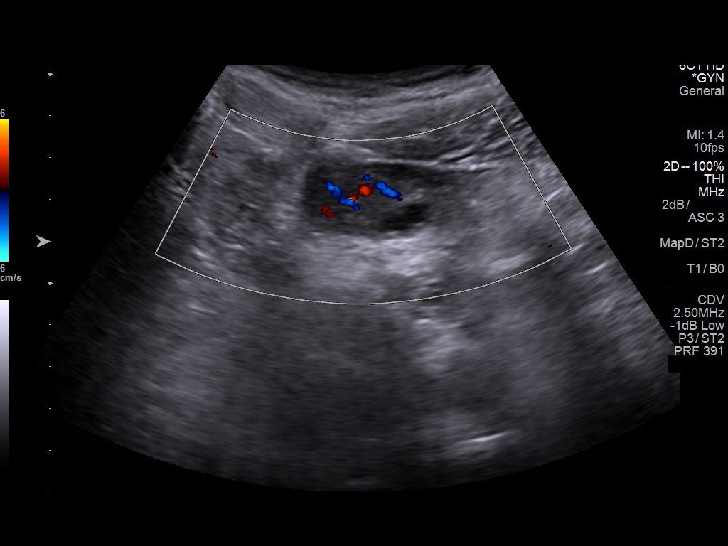
[im 16/47]
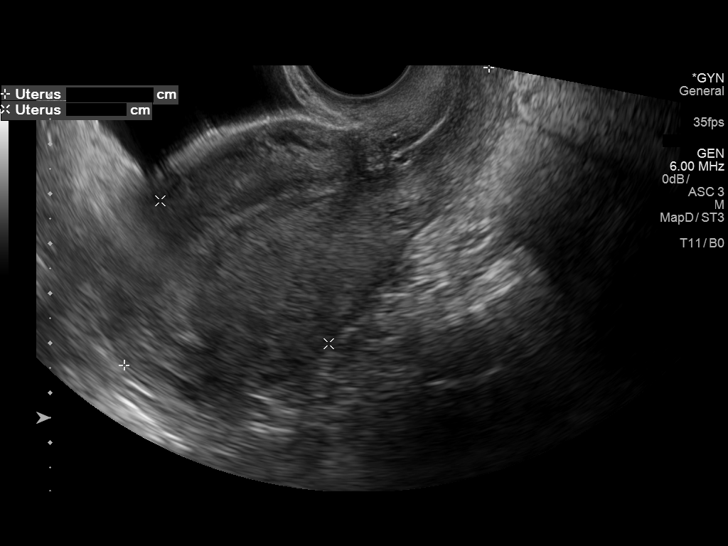
[im 18/47]
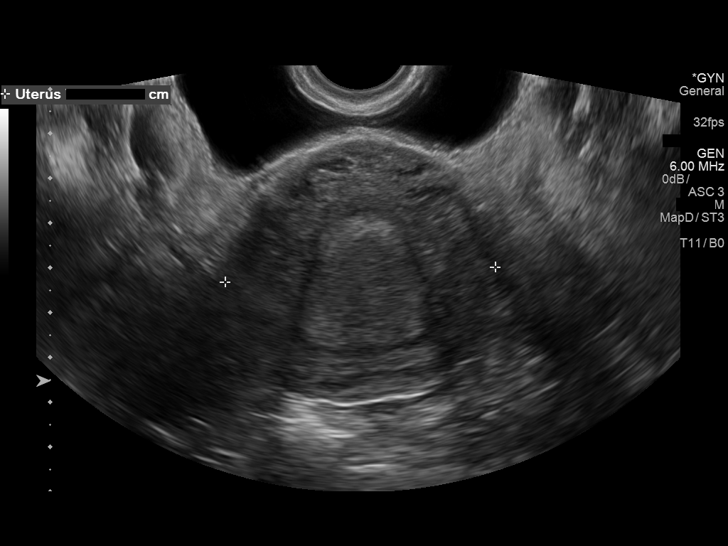
[im 22/47]
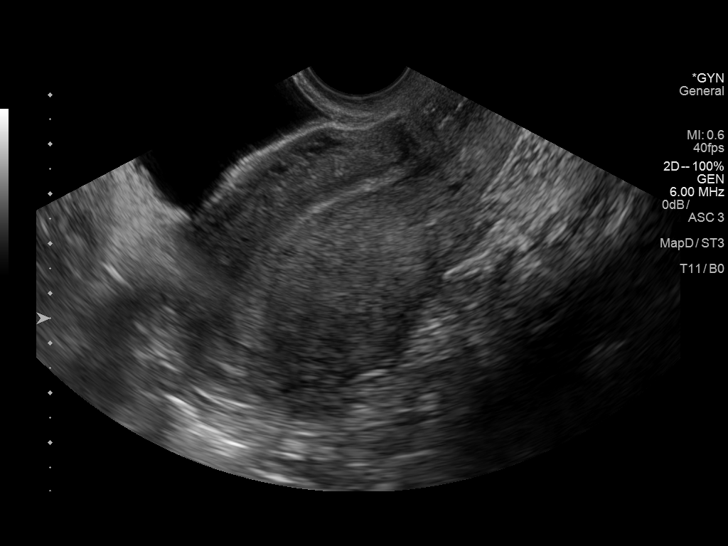
[im 25/47]
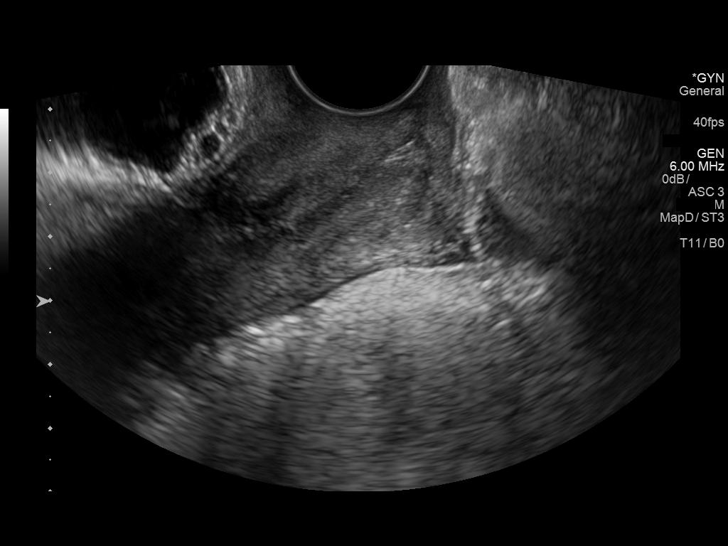
[im 29/47]
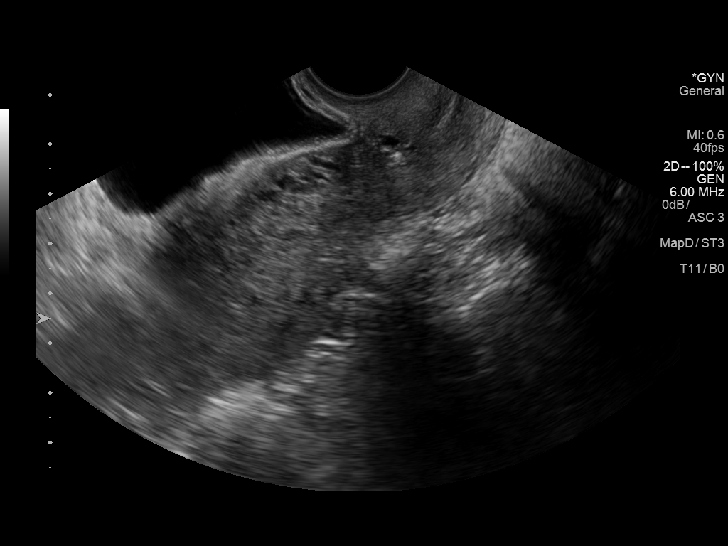
[im 31/47]
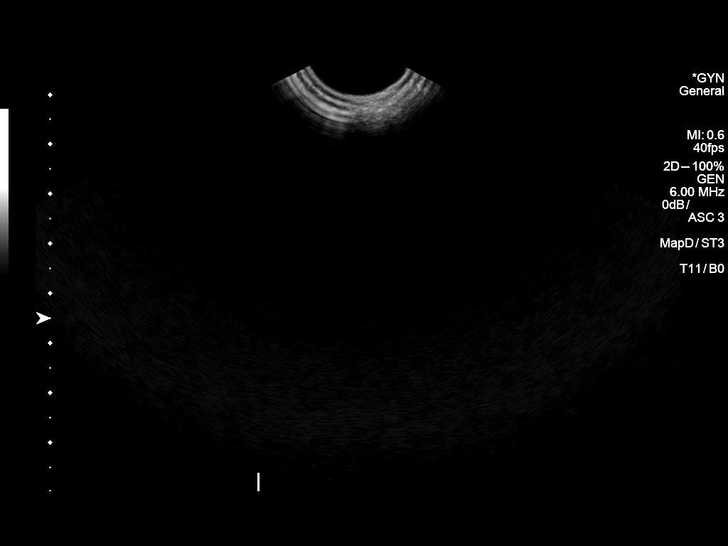
[im 35/47]
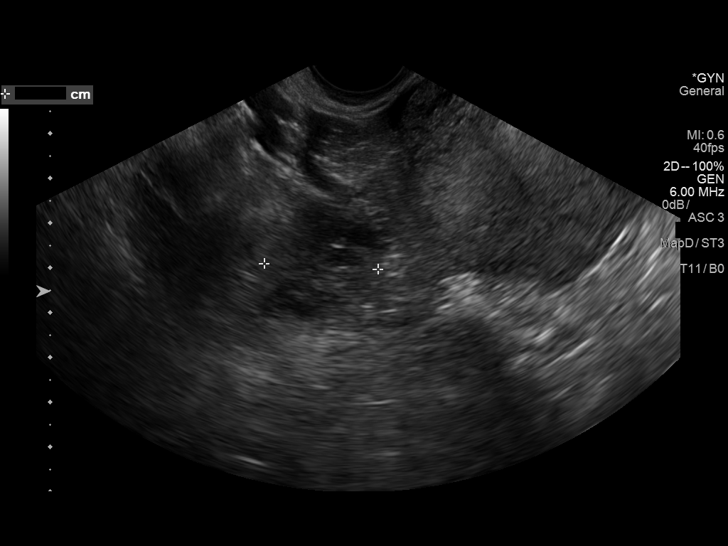
[im 39/47]
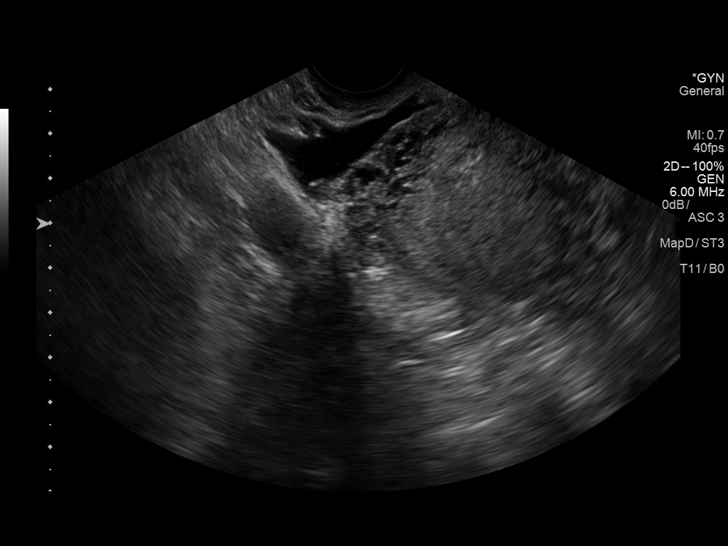
[im 43/47]
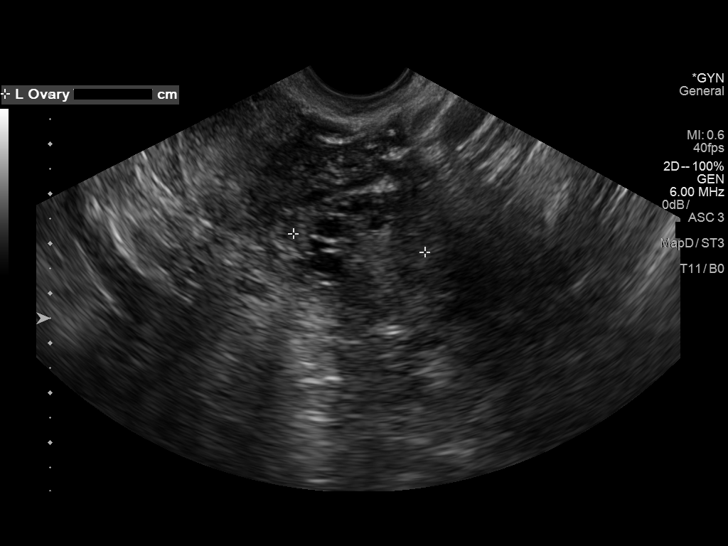
[im 47/47]
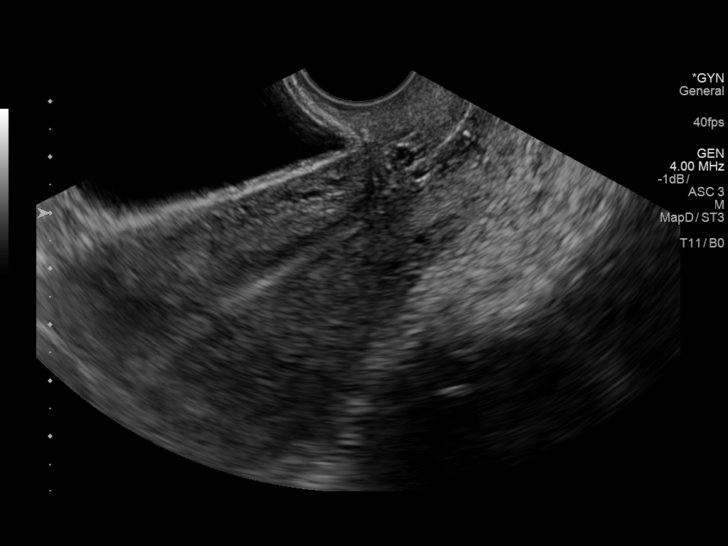

[14 of 25 positions shown; findings below may reference images not displayed]

FINDINGS: Uterus

Measurements: 9.5 x 4.4 x 6.0 cm. No fibroids or other mass
visualized.

Endometrium

Thickness: 8.0 mm.  No focal abnormality visualized.

Right ovary

Measurements: 4.0 x 2.9 x 2.5 cm. Normal appearance/no adnexal mass.

Left ovary

Measurements: 3.5 x 1.6 x 2.7 cm. Normal appearance/no adnexal mass.

Other findings

Trace free pelvic fluid noted on the right.
IMPRESSION: Trace free pelvic fluid otherwise negative exam.

## 2018-09-19 ENCOUNTER — Inpatient Hospital Stay (HOSPITAL_COMMUNITY): Payer: Medicaid Other

## 2018-09-19 ENCOUNTER — Encounter (HOSPITAL_COMMUNITY): Payer: Self-pay

## 2018-09-19 ENCOUNTER — Inpatient Hospital Stay (HOSPITAL_COMMUNITY)
Admission: AD | Admit: 2018-09-19 | Discharge: 2018-09-19 | Disposition: A | Discharge status: Home / Self Care | Payer: Medicaid Other | Attending: Obstetrics and Gynecology | Admitting: Obstetrics and Gynecology

## 2018-09-19 ENCOUNTER — Other Ambulatory Visit: Payer: Self-pay

## 2018-09-19 DIAGNOSIS — O208 Other hemorrhage in early pregnancy: Secondary | ICD-10-CM | POA: Insufficient documentation

## 2018-09-19 DIAGNOSIS — Z6741 Type O blood, Rh negative: Secondary | ICD-10-CM | POA: Diagnosis not present

## 2018-09-19 DIAGNOSIS — Z880 Allergy status to penicillin: Secondary | ICD-10-CM | POA: Insufficient documentation

## 2018-09-19 DIAGNOSIS — O26891 Other specified pregnancy related conditions, first trimester: Secondary | ICD-10-CM | POA: Diagnosis not present

## 2018-09-19 DIAGNOSIS — R1032 Left lower quadrant pain: Secondary | ICD-10-CM | POA: Insufficient documentation

## 2018-09-19 DIAGNOSIS — O99011 Anemia complicating pregnancy, first trimester: Secondary | ICD-10-CM | POA: Diagnosis not present

## 2018-09-19 DIAGNOSIS — Z87440 Personal history of urinary (tract) infections: Secondary | ICD-10-CM | POA: Insufficient documentation

## 2018-09-19 DIAGNOSIS — D509 Iron deficiency anemia, unspecified: Secondary | ICD-10-CM | POA: Diagnosis not present

## 2018-09-19 DIAGNOSIS — Z91013 Allergy to seafood: Secondary | ICD-10-CM | POA: Diagnosis not present

## 2018-09-19 DIAGNOSIS — Z888 Allergy status to other drugs, medicaments and biological substances status: Secondary | ICD-10-CM | POA: Diagnosis not present

## 2018-09-19 DIAGNOSIS — O468X1 Other antepartum hemorrhage, first trimester: Secondary | ICD-10-CM

## 2018-09-19 DIAGNOSIS — Z3A01 Less than 8 weeks gestation of pregnancy: Secondary | ICD-10-CM | POA: Diagnosis not present

## 2018-09-19 DIAGNOSIS — O09521 Supervision of elderly multigravida, first trimester: Secondary | ICD-10-CM | POA: Diagnosis not present

## 2018-09-19 DIAGNOSIS — R109 Unspecified abdominal pain: Secondary | ICD-10-CM

## 2018-09-19 DIAGNOSIS — O418X10 Other specified disorders of amniotic fluid and membranes, first trimester, not applicable or unspecified: Secondary | ICD-10-CM

## 2018-09-19 DIAGNOSIS — O360910 Maternal care for other rhesus isoimmunization, first trimester, not applicable or unspecified: Secondary | ICD-10-CM

## 2018-09-19 HISTORY — DX: Herpesviral infection, unspecified: B00.9

## 2018-09-19 LAB — COMPREHENSIVE METABOLIC PANEL
ALT: 14 U/L (ref 0–44)
AST: 18 U/L (ref 15–41)
Albumin: 3.9 g/dL (ref 3.5–5.0)
Alkaline Phosphatase: 51 U/L (ref 38–126)
Anion gap: 8 (ref 5–15)
BUN: 7 mg/dL (ref 6–20)
CO2: 22 mmol/L (ref 22–32)
Calcium: 9.3 mg/dL (ref 8.9–10.3)
Chloride: 106 mmol/L (ref 98–111)
Creatinine, Ser: 0.74 mg/dL (ref 0.44–1.00)
GFR calc Af Amer: >60 mL/min (ref >60)
GFR calc non Af Amer: >60 mL/min (ref >60)
Glucose, Bld: 94 mg/dL (ref 70–99)
Potassium: 3.5 mmol/L (ref 3.5–5.1)
Sodium: 136 mmol/L (ref 135–145)
Total Bilirubin: 0.6 mg/dL (ref 0.3–1.2)
Total Protein: 7.6 g/dL (ref 6.5–8.1)

## 2018-09-19 LAB — POCT PREGNANCY, URINE: Preg Test, Ur: POSITIVE — AB

## 2018-09-19 LAB — CBC WITH DIFFERENTIAL/PLATELET
Abs Immature Granulocytes: 0 10*3/uL (ref 0.00–0.07)
Basophils Absolute: 0 10*3/uL (ref 0.0–0.1)
Basophils Relative: 1 %
Eosinophils Absolute: 0.1 10*3/uL (ref 0.0–0.5)
Eosinophils Relative: 1 %
HCT: 25.6 % — ABNORMAL LOW (ref 36.0–46.0)
Hemoglobin: 7.1 g/dL — ABNORMAL LOW (ref 12.0–15.0)
Immature Granulocytes: 0 %
Lymphocytes Relative: 39 %
Lymphs Abs: 1.7 10*3/uL (ref 0.7–4.0)
MCH: 18 pg — ABNORMAL LOW (ref 26.0–34.0)
MCHC: 27.7 g/dL — ABNORMAL LOW (ref 30.0–36.0)
MCV: 65 fL — ABNORMAL LOW (ref 80.0–100.0)
Monocytes Absolute: 0.6 10*3/uL (ref 0.1–1.0)
Monocytes Relative: 13 %
Neutro Abs: 2 10*3/uL (ref 1.7–7.7)
Neutrophils Relative %: 46 %
Platelets: 352 10*3/uL (ref 150–400)
RBC: 3.94 MIL/uL (ref 3.87–5.11)
RDW: 19.9 % — ABNORMAL HIGH (ref 11.5–15.5)
WBC: 4.4 10*3/uL (ref 4.0–10.5)
nRBC: 0 % (ref 0.0–0.2)

## 2018-09-19 LAB — URINALYSIS, ROUTINE W REFLEX MICROSCOPIC
Bilirubin Urine: NEGATIVE
Glucose, UA: NEGATIVE mg/dL
Hgb urine dipstick: NEGATIVE
Ketones, ur: NEGATIVE mg/dL
Nitrite: NEGATIVE
Protein, ur: NEGATIVE mg/dL
Specific Gravity, Urine: 1.017 (ref 1.005–1.030)
pH: 7 (ref 5.0–8.0)

## 2018-09-19 LAB — HCG, QUANTITATIVE, PREGNANCY: hCG, Beta Chain, Quant, S: 9295 m[IU]/mL — ABNORMAL HIGH (ref <5)

## 2018-09-19 LAB — ABO/RH
ABO/RH(D): O NEG
Antibody Screen: NEGATIVE

## 2018-09-19 MED ORDER — IRON 325 (65 FE) MG PO TABS: 1.0000 | ORAL_TABLET | Freq: Two times a day (BID) | ORAL | 0 refills | Status: AC

## 2018-09-19 NOTE — MAU Note (Signed)
Been feeling real just blah and cramping.  Did a preg test yesterday, was positive.  No bleeding.

## 2018-09-19 NOTE — MAU Provider Note (Signed)
History     CSN: [id number]  Arrival date and time: 09/19/18 1344   First Provider Initiated Contact with Patient 09/19/18 1547      Chief Complaint  Patient presents with  . Abdominal Pain  . Possible Pregnancy   HPI   Miranda Tanner is a 33 y.o. female 13P2002 @ 3731w2d here with LLQ pain that started a few days ago. She had a positive home pregnancy test. The pain comes and goes. She has not taken anything for the pain. No bleeding. She currently rates her pain 4/10. Wants to make sure everything is ok.   OB History    Gravida  3   Para  2   Term  2   Preterm      AB      Living  2     SAB      TAB      Ectopic      Multiple      Live Births  2           Past Medical History:  Diagnosis Date  . Abnormal Pap smear 2004   ?LGSIL  with Cryo  . Anemia    currently on iron  . Heartburn in pregnancy   . HSV-2 infection   . Urinary tract infection     Past Surgical History:  Procedure Laterality Date  . CESAREAN SECTION    . CESAREAN SECTION  01/13/2012   Procedure: CESAREAN SECTION;  Surgeon: Kirkland HunArthur Stringer, MD;  Location: [facility];  Service: Obstetrics;  Laterality: N/A;  Repeat  . CRYOTHERAPY  2004  . TONSILLECTOMY AND ADENOIDECTOMY    . WISDOM TOOTH EXTRACTION      Family History  Problem Relation Age of Onset  . Diabetes Maternal Grandmother   . Stroke Maternal Grandmother 4765  . Diabetes Maternal Grandfather     Social History   Tobacco Use  . Smoking status: Never Smoker  . Smokeless tobacco: Never Used  Substance Use Topics  . Alcohol use: No    Comment: social - none since pregnant  . Drug use: No    Allergies:  Allergies  Allergen Reactions  . Penicillins Shortness Of Breath and Swelling    Tongue swelling  Has patient had a PCN reaction causing immediate rash, facial/tongue/throat swelling, SOB or lightheadedness with hypotension: yes Has patient had a PCN reaction causing severe rash involving mucus membranes or skin  necrosis: unknown Has patient had a PCN reaction that required hospitalization: called MD Has patient had a PCN reaction occurring within the last 10 years: yes If all of the above answers are "NO", then may proceed with Cephalosporin use.   . Shellfish Allergy Anaphylaxis    PT IS ALLERGIC TO CRABMEAT  . Claritin-D 12 Hour [Loratadine-Pseudoephedrine Er] Hives    Medications Prior to Admission  Medication Sig Dispense Refill Last Dose  . ibuprofen (ADVIL,MOTRIN) 600 MG tablet Take 1 tablet (600 mg total) by mouth every 6 (six) hours as needed. 30 tablet 0   . methocarbamol (ROBAXIN) 500 MG tablet Take 1 tablet (500 mg total) by mouth 2 (two) times daily as needed for muscle spasms. 10 tablet 0    Results for orders placed or performed during the hospital encounter of 09/19/18 (from the past 48 hour(s))  Urinalysis, Routine w reflex microscopic     Status: Abnormal   Collection Time: 09/19/18  2:40 PM  Result Value Ref Range   Color, Urine YELLOW YELLOW   APPearance  CLEAR CLEAR   Specific Gravity, Urine 1.017 1.005 - 1.030   pH 7.0 5.0 - 8.0   Glucose, UA NEGATIVE NEGATIVE mg/dL   Hgb urine dipstick NEGATIVE NEGATIVE   Bilirubin Urine NEGATIVE NEGATIVE   Ketones, ur NEGATIVE NEGATIVE mg/dL   Protein, ur NEGATIVE NEGATIVE mg/dL   Nitrite NEGATIVE NEGATIVE   Leukocytes,Ua TRACE (A) NEGATIVE   RBC / HPF 0-5 0 - 5 RBC/hpf   WBC, UA 6-10 0 - 5 WBC/hpf   Bacteria, UA RARE (A) NONE SEEN   Squamous Epithelial / LPF 0-5 0 - 5   Mucus PRESENT     Comment: Performed at [facility], [address] [place], [place] [address]  Pregnancy, urine POC     Status: Abnormal   Collection Time: 09/19/18  2:42 PM  Result Value Ref Range   Preg Test, Ur POSITIVE (A) NEGATIVE    Comment:        THE SENSITIVITY OF THIS METHODOLOGY IS >24 mIU/mL   CBC with Differential/Platelet     Status: Abnormal   Collection Time: 09/19/18  3:15 PM  Result Value Ref Range   WBC 4.4 4.0 - 10.5 K/uL    RBC 3.94 3.87 - 5.11 MIL/uL   Hemoglobin 7.1 (L) 12.0 - 15.0 g/dL    Comment: Reticulocyte Hemoglobin testing may be clinically indicated, consider ordering this additional test [id number]    HCT 25.6 (L) 36.0 - 46.0 %   MCV 65.0 (L) 80.0 - 100.0 fL   MCH 18.0 (L) 26.0 - 34.0 pg   MCHC 27.7 (L) 30.0 - 36.0 g/dL   RDW 11.919.9 (H) 14.711.5 - 82.915.5 %   Platelets 352 150 - 400 K/uL    Comment: REPEATED TO VERIFY   nRBC 0.0 0.0 - 0.2 %   Neutrophils Relative % 46 %   Neutro Abs 2.0 1.7 - 7.7 K/uL   Lymphocytes Relative 39 %   Lymphs Abs 1.7 0.7 - 4.0 K/uL   Monocytes Relative 13 %   Monocytes Absolute 0.6 0.1 - 1.0 K/uL   Eosinophils Relative 1 %   Eosinophils Absolute 0.1 0.0 - 0.5 K/uL   Basophils Relative 1 %   Basophils Absolute 0.0 0.0 - 0.1 K/uL   Immature Granulocytes 0 %   Abs Immature Granulocytes 0.00 0.00 - 0.07 K/uL   Polychromasia PRESENT    Ovalocytes PRESENT     Comment: Performed at [facility], [address] [place], [place] [address]  Comprehensive metabolic panel     Status: None   Collection Time: 09/19/18  3:15 PM  Result Value Ref Range   Sodium 136 135 - 145 mmol/L   Potassium 3.5 3.5 - 5.1 mmol/L   Chloride 106 98 - 111 mmol/L   CO2 22 22 - 32 mmol/L   Glucose, Bld 94 70 - 99 mg/dL   BUN 7 6 - 20 mg/dL   Creatinine, Ser 0.860.74 0.44 - 1.00 mg/dL   Calcium 9.3 8.9 - 57.810.3 mg/dL   Total Protein 7.6 6.5 - 8.1 g/dL   Albumin 3.9 3.5 - 5.0 g/dL   AST 18 15 - 41 U/L   ALT 14 0 - 44 U/L   Alkaline Phosphatase 51 38 - 126 U/L   Total Bilirubin 0.6 0.3 - 1.2 mg/dL   GFR calc non Af Amer >60 >60 mL/min   GFR calc Af Amer >60 >60 mL/min   Anion gap 8 5 - 15    Comment: Performed at  [facility], [address] [place], [place] [address]  hCG, quantitative, pregnancy     Status: Abnormal   Collection Time: 09/19/18  3:15 PM  Result Value Ref Range   hCG, Beta Chain, Quant, S 9,295 (H) <5 mIU/mL    Comment:          GEST. AGE      CONC.  (mIU/mL)   <=1  WEEK        5 - 50     2 WEEKS       50 - 500     3 WEEKS       100 - 10,000     4 WEEKS     1,000 - 30,000     5 WEEKS     3,500 - 115,000   6-8 WEEKS     12,000 - 270,000    12 WEEKS     15,000 - 220,000        FEMALE AND NON-PREGNANT FEMALE:     LESS THAN 5 mIU/mL Performed at [facility], [address] [place], [place] [address]   ABO/Rh     Status: None   Collection Time: 09/19/18  3:16 PM  Result Value Ref Range   ABO/RH(D) O NEG    Antibody Screen      NEG Performed at [facility], [address], [place], [place] [address]    US Ob Less Than 14 Weeks With Ob Transvaginal  Result Date: 09/19/2018 CLINICAL DATA:  Abdominal pain in the first trimester of pregnancy EXAM: OBSTETRIC <14 WK [name] AND TRANSVAGINAL OB US TECHNIQUE: Both transabdominal and transvaginal ultrasound examinations were performed for complete evaluation of the gestation as well as the maternal uterus, adnexal regions, and pelvic cul-de-sac. Transvaginal technique was performed to assess early pregnancy. COMPARISON:  None for this gestation FINDINGS: Intrauterine gestational sac: Present, single Yolk sac:  Present Embryo:  Not visualize Cardiac Activity: N/A Heart Rate: N/A  bpm MSD: 8.5 mm   5 w   4 d Subchorionic hemorrhage: Moderate subchorionic hemorrhage 2.6 x 1.8 x 0.4 cm Maternal uterus/adnexae: RIGHT ovary normal size and morphology, 2.7 x 4.5 x 3.7 cm. LEFT ovary normal size and morphology, 3.8 x 1.2 x 2.5 cm. No free pelvic fluid or adnexal masses. IMPRESSION: Small gestational sac seen within the uterus, measured at 5 weeks 4 days EGA by mean sac diameter. Yolk sac is visualized but no fetal pole is identified; may consider follow-up ultrasound in 14 days to assess viability if clinically indicated. Moderate subchorionic hemorrhage. Electronically Signed   By: Ulyses Southward M.D.   On: 09/19/2018 17:54    Review of Systems  Constitutional: Negative for fatigue and fever.  Gastrointestinal:  Positive for abdominal pain.  Genitourinary: Negative for vaginal bleeding and vaginal discharge.  Neurological: Negative for dizziness.   Physical Exam   Blood pressure 124/71, pulse 96, temperature 100.1 F (37.8 C), temperature source Oral, resp. rate 16, height 5\' 1"  (1.549 m), weight 68.4 kg, last menstrual period 08/13/2018, SpO2 100 %.  Physical Exam  Constitutional: She is oriented to person, place, and time. She appears well-developed and well-nourished. No distress.  HENT:  Head: Normocephalic.  GI: Soft. She exhibits no distension. There is no abdominal tenderness. There is no rebound.  Musculoskeletal: Normal range of motion.  Neurological: She is alert and oriented to person, place, and time.  Skin: Skin is warm. She is not diaphoretic.  Psychiatric: Her behavior  is normal.    MAU Course  Procedures  None  MDM  O negative blood type: patient not bleeding.  Patient declined STI testing  HIV, CBC, Hcg, ABO US OB transvaginal   Assessment and Plan   A:  1. Type O blood, Rh negative   2. Abdominal pain in pregnancy, first trimester   3. [redacted] weeks gestation of pregnancy   4. Iron deficiency anemia, unspecified iron deficiency anemia type   5. Subchorionic hematoma in first trimester, single or unspecified fetus     P:  Discharge home in stable condition Start prenatal care. Fereheme ordered for outpatient 2 doses. Will call patient to schedule Rx: Iron BID Discussed Subchorionic hemorrhage in detail. Return to MAU if bleeding; patient will need rhogam. Return if symptoms worsen  Matas Burrows, Artist Pais, NP 09/19/2018 8:03 PM

## 2018-09-19 NOTE — Discharge Instructions (Signed)
Anemia  Anemia is a condition in which you do not have enough red blood cells or hemoglobin. Hemoglobin is a substance in red blood cells that carries oxygen. When you do not have enough red blood cells or hemoglobin (are anemic), your body cannot get enough oxygen and your organs may not work properly. As a result, you may feel very tired or have other problems. What are the causes? Common causes of anemia include:  Excessive bleeding. Anemia can be caused by excessive bleeding inside or outside the body, including bleeding from the intestine or from periods in women.  Poor nutrition.  Long-lasting (chronic) kidney, thyroid, and liver disease.  Bone marrow disorders.  Cancer and treatments for cancer.  HIV (human immunodeficiency virus) and AIDS (acquired immunodeficiency syndrome).  Treatments for HIV and AIDS.  Spleen problems.  Blood disorders.  Infections, medicines, and autoimmune disorders that destroy red blood cells. What are the signs or symptoms? Symptoms of this condition include:  Minor weakness.  Dizziness.  Headache.  Feeling heartbeats that are irregular or faster than normal (palpitations).  Shortness of breath, especially with exercise.  Paleness.  Cold sensitivity.  Indigestion.  Nausea.  Difficulty sleeping.  Difficulty concentrating. Symptoms may occur suddenly or develop slowly. If your anemia is mild, you may not have symptoms. How is this diagnosed? This condition is diagnosed based on:  Blood tests.  Your medical history.  A physical exam.  Bone marrow biopsy. Your health care provider may also check your stool (feces) for blood and may do additional testing to look for the cause of your bleeding. You may also have other tests, including:  Imaging tests, such as a CT scan or MRI.  Endoscopy.  Colonoscopy. How is this treated? Treatment for this condition depends on the cause. If you continue to lose a lot of blood, you may  need to be treated at a hospital. Treatment may include:  Taking supplements of iron, vitamin M08, or folic acid.  Taking a hormone medicine (erythropoietin) that can help to stimulate red blood cell growth.  Having a blood transfusion. This may be needed if you lose a lot of blood.  Making changes to your diet.  Having surgery to remove your spleen. Follow these instructions at home:  Take over-the-counter and prescription medicines only as told by your health care provider.  Take supplements only as told by your health care provider.  Follow any diet instructions that you were given.  Keep all follow-up visits as told by your health care provider. This is important. Contact a health care provider if:  You develop new bleeding anywhere in the body. Get help right away if:  You are very weak.  You are short of breath.  You have pain in your abdomen or chest.  You are dizzy or feel faint.  You have trouble concentrating.  You have bloody or black, tarry stools.  You vomit repeatedly or you vomit up blood. Summary  Anemia is a condition in which you do not have enough red blood cells or enough of a substance in your red blood cells that carries oxygen (hemoglobin).  Symptoms may occur suddenly or develop slowly.  If your anemia is mild, you may not have symptoms.  This condition is diagnosed with blood tests as well as a medical history and physical exam. Other tests may be needed.  Treatment for this condition depends on the cause of the anemia. This information is not intended to replace advice given to you by  your health care provider. Make sure you discuss any questions you have with your health care provider. °Document Released: 02/19/2004 Document Revised: 12/24/2016 Document Reviewed: 02/13/2016 °Elsevier Patient Education © 2020 [employer] ° °

## 2018-09-21 ENCOUNTER — Other Ambulatory Visit (HOSPITAL_COMMUNITY): Payer: Self-pay | Admitting: *Deleted

## 2018-09-22 ENCOUNTER — Other Ambulatory Visit: Payer: Self-pay

## 2018-09-22 ENCOUNTER — Ambulatory Visit (HOSPITAL_COMMUNITY)
Admission: RE | Admit: 2018-09-22 | Discharge: 2018-09-22 | Disposition: A | Discharge status: Home / Self Care | Payer: Medicaid Other | Source: Ambulatory Visit | Attending: Obstetrics and Gynecology | Admitting: Obstetrics and Gynecology

## 2018-09-22 DIAGNOSIS — D509 Iron deficiency anemia, unspecified: Secondary | ICD-10-CM | POA: Insufficient documentation

## 2018-09-22 MED ORDER — SODIUM CHLORIDE 0.9 % IV SOLN
510.0000 mg | INTRAVENOUS | Status: DC
  Administered 2018-09-22: 510 mg via INTRAVENOUS
  Filled 2018-09-22: qty 17

## 2018-09-22 NOTE — Discharge Instructions (Signed)

## 2018-09-28 ENCOUNTER — Other Ambulatory Visit: Payer: Self-pay

## 2018-09-28 ENCOUNTER — Ambulatory Visit (HOSPITAL_COMMUNITY)
Admission: RE | Admit: 2018-09-28 | Discharge: 2018-09-28 | Disposition: A | Discharge status: Home / Self Care | Payer: Medicaid Other | Source: Ambulatory Visit | Attending: Obstetrics and Gynecology | Admitting: Obstetrics and Gynecology

## 2018-09-28 DIAGNOSIS — Z3A00 Weeks of gestation of pregnancy not specified: Secondary | ICD-10-CM | POA: Insufficient documentation

## 2018-09-28 DIAGNOSIS — O99019 Anemia complicating pregnancy, unspecified trimester: Secondary | ICD-10-CM | POA: Diagnosis not present

## 2018-09-28 MED ORDER — SODIUM CHLORIDE 0.9 % IV SOLN
510.0000 mg | INTRAVENOUS | Status: AC
  Administered 2018-09-28: 510 mg via INTRAVENOUS
  Filled 2018-09-28: qty 17

## 2019-09-01 ENCOUNTER — Encounter (HOSPITAL_COMMUNITY): Payer: Self-pay | Admitting: Emergency Medicine

## 2019-09-01 ENCOUNTER — Other Ambulatory Visit: Payer: Self-pay

## 2019-09-01 DIAGNOSIS — R102 Pelvic and perineal pain: Secondary | ICD-10-CM | POA: Insufficient documentation

## 2019-09-01 DIAGNOSIS — R109 Unspecified abdominal pain: Secondary | ICD-10-CM | POA: Diagnosis present

## 2019-09-01 LAB — CBC
HCT: 35.5 % — ABNORMAL LOW (ref 36.0–46.0)
Hemoglobin: 11.1 g/dL — ABNORMAL LOW (ref 12.0–15.0)
MCH: 27.3 pg (ref 26.0–34.0)
MCHC: 31.3 g/dL (ref 30.0–36.0)
MCV: 87.4 fL (ref 80.0–100.0)
Platelets: 282 10*3/uL (ref 150–400)
RBC: 4.06 MIL/uL (ref 3.87–5.11)
RDW: 13.2 % (ref 11.5–15.5)
WBC: 6.3 10*3/uL (ref 4.0–10.5)
nRBC: 0 % (ref 0.0–0.2)

## 2019-09-01 LAB — URINALYSIS, ROUTINE W REFLEX MICROSCOPIC
Bacteria, UA: NONE SEEN
Bilirubin Urine: NEGATIVE
Glucose, UA: NEGATIVE mg/dL
Hgb urine dipstick: NEGATIVE
Ketones, ur: NEGATIVE mg/dL
Nitrite: NEGATIVE
Protein, ur: NEGATIVE mg/dL
Specific Gravity, Urine: 1.025 (ref 1.005–1.030)
pH: 6 (ref 5.0–8.0)

## 2019-09-01 LAB — I-STAT BETA HCG BLOOD, ED (MC, WL, AP ONLY): I-stat hCG, quantitative: <5 m[IU]/mL (ref <5)

## 2019-09-01 NOTE — ED Triage Notes (Signed)
Patient c/o lower abdominal pain since yesterday. Denies N/V/D and urinary sx.

## 2019-09-02 ENCOUNTER — Emergency Department (HOSPITAL_COMMUNITY)
Admission: EM | Admit: 2019-09-02 | Discharge: 2019-09-02 | Disposition: A | Discharge status: Home / Self Care | Payer: Medicaid Other | Attending: Emergency Medicine | Admitting: Emergency Medicine

## 2019-09-02 DIAGNOSIS — R102 Pelvic and perineal pain: Secondary | ICD-10-CM

## 2019-09-02 LAB — COMPREHENSIVE METABOLIC PANEL
ALT: 17 U/L (ref 0–44)
AST: 17 U/L (ref 15–41)
Albumin: 4.3 g/dL (ref 3.5–5.0)
Alkaline Phosphatase: 62 U/L (ref 38–126)
Anion gap: 9 (ref 5–15)
BUN: 9 mg/dL (ref 6–20)
CO2: 25 mmol/L (ref 22–32)
Calcium: 9.1 mg/dL (ref 8.9–10.3)
Chloride: 102 mmol/L (ref 98–111)
Creatinine, Ser: 0.74 mg/dL (ref 0.44–1.00)
GFR calc Af Amer: >60 mL/min (ref >60)
GFR calc non Af Amer: >60 mL/min (ref >60)
Glucose, Bld: 108 mg/dL — ABNORMAL HIGH (ref 70–99)
Potassium: 3.7 mmol/L (ref 3.5–5.1)
Sodium: 136 mmol/L (ref 135–145)
Total Bilirubin: 0.5 mg/dL (ref 0.3–1.2)
Total Protein: 8 g/dL (ref 6.5–8.1)

## 2019-09-02 LAB — WET PREP, GENITAL
Clue Cells Wet Prep HPF POC: NONE SEEN
Sperm: NONE SEEN
Trich, Wet Prep: NONE SEEN
Yeast Wet Prep HPF POC: NONE SEEN

## 2019-09-02 LAB — LIPASE, BLOOD: Lipase: 29 U/L (ref 11–51)

## 2019-09-02 NOTE — ED Notes (Signed)
D/c paperwork reviewed with pt.  Pt with no questions or concerns at time of discharge, ambulatory to ED entrance.

## 2019-09-02 NOTE — ED Provider Notes (Signed)
[facility]-EMERGENCY DEPT Provider Note   CSN: [id number] Arrival date & time: 09/01/19  2230     History Chief Complaint  Patient presents with  . Abdominal Pain    Miranda Tanner is a 34 y.o. female.  HPI      Miranda Tanner is a 34 y.o. female, with a history of genital herpes, anemia, presenting to the ED with abdominal pain beginning last night. Pain noted in the suprapubic region, sharp/cramping, severe, nonradiating. Patient states the pain has significantly improved during her time in the waiting room overnight.  She describes the pain as a mild soreness currently.  She states, "If it felt like this last night, I would not have come to the ER." States it does not feel like UTI, however, she also states she is not sure she has ever had a UTI. LMP July 26.  Patient was last pregnant November 2020. Denies fever/chills, N/V/C/D, chest pain, shortness of breath, cough, urinary symptoms, abnormal vaginal discharge, vaginal bleeding, hematochezia/melena, or any other complaints.   Past Medical History:  Diagnosis Date  . Abnormal Pap smear 2004   ?LGSIL  with Cryo  . Anemia    currently on iron  . Heartburn in pregnancy   . HSV-2 infection   . Urinary tract infection     Patient Active Problem List   Diagnosis Date Noted  . Status post repeat low transverse cesarean section 01/13/2012  . Nausea and vomiting in pregnancy 11/21/2011  . Pregnant state, incidental 11/01/2011  . Abnormal Pap smear   . Urinary tract infection   . Anemia   . Rh negative state in antepartum period 06/02/2011  . Previous cesarean delivery affecting pregnancy 06/02/2011  . ANEMIA, IRON DEFICIENCY 11/18/2009    Past Surgical History:  Procedure Laterality Date  . CESAREAN SECTION    . CESAREAN SECTION  01/13/2012   Procedure: CESAREAN SECTION;  Surgeon: Kirkland Hun, MD;  Location: [facility];  Service: Obstetrics;  Laterality: N/A;  Repeat  . CRYOTHERAPY  2004  .  TONSILLECTOMY AND ADENOIDECTOMY    . WISDOM TOOTH EXTRACTION       OB History    Gravida  3   Para  2   Term  2   Preterm      AB      Living  2     SAB      TAB      Ectopic      Multiple      Live Births  2           Family History  Problem Relation Age of Onset  . Diabetes Maternal Grandmother   . Stroke Maternal Grandmother 55  . Diabetes Maternal Grandfather     Social History   Tobacco Use  . Smoking status: Never Smoker  . Smokeless tobacco: Never Used  Substance Use Topics  . Alcohol use: No    Comment: social - none since pregnant  . Drug use: No    Home Medications Prior to Admission medications   Medication Sig Start Date End Date Taking? Authorizing Provider  Ferrous Sulfate (IRON) 325 (65 Fe) MG TABS Take 1 tablet (325 mg total) by mouth 2 (two) times daily with a meal. 09/19/18   Rasch, Harolyn Rutherford, NP    Allergies    Penicillins, Shellfish allergy, and Claritin-d 12 hour [loratadine-pseudoephedrine er]  Review of Systems   Review of Systems  Constitutional: Negative for chills, diaphoresis and fever.  Respiratory: Negative for cough and shortness of breath.   Cardiovascular: Negative for chest pain.  Gastrointestinal: Positive for abdominal pain. Negative for diarrhea, nausea and vomiting.  Genitourinary: Negative for dysuria, flank pain, frequency, hematuria, vaginal bleeding and vaginal discharge.  Neurological: Negative for dizziness, syncope and weakness.  All other systems reviewed and are negative.   Physical Exam Updated Vital Signs BP 133/71 (BP Location: Right Arm)   Pulse 82   Temp 98 F (36.7 C) (Oral)   Resp 19   Ht 5\' 2"  (1.575 m)   Wt 67.6 kg   LMP 08/20/2019   SpO2 98%   BMI 27.25 kg/m   Physical Exam Vitals and nursing note reviewed.  Constitutional:      General: She is not in acute distress.    Appearance: She is well-developed. She is not diaphoretic.  HENT:     Head: Normocephalic and  atraumatic.     Mouth/Throat:     Mouth: Mucous membranes are moist.     Pharynx: Oropharynx is clear.  Eyes:     Conjunctiva/sclera: Conjunctivae normal.  Cardiovascular:     Rate and Rhythm: Normal rate and regular rhythm.     Pulses: Normal pulses.          Radial pulses are 2+ on the right side and 2+ on the left side.  Pulmonary:     Effort: Pulmonary effort is normal. No respiratory distress.  Abdominal:     Palpations: Abdomen is soft.     Tenderness: There is no abdominal tenderness. There is no guarding.     Comments: No reaction to palpation of the abdomen.  She indicates that she has "some soreness" in the suprapubic region.  Genitourinary:    Comments: External genitalia normal Vagina without discharge Cervix - very small areas resembling bruising perhaps 1-2 mm in diameter negative for cervical motion tenderness Adnexa palpated, no masses, negative for tenderness noted Bladder palpated positive for tenderness Uterus palpated no masses, negative for tenderness  No inguinal lymphadenopathy. Otherwise normal female genitalia. RN, Carolynne, served as 08/22/2019 during exam. Musculoskeletal:     Cervical back: Neck supple.  Skin:    General: Skin is warm and dry.  Neurological:     Mental Status: She is alert.  Psychiatric:        Mood and Affect: Mood and affect normal.        Speech: Speech normal.        Behavior: Behavior normal.     ED Results / Procedures / Treatments   Labs (all labs ordered are listed, but only abnormal results are displayed) Labs Reviewed  WET PREP, GENITAL - Abnormal; Notable for the following components:      Result Value   WBC, Wet Prep HPF POC MODERATE (*)    All other components within normal limits  COMPREHENSIVE METABOLIC PANEL - Abnormal; Notable for the following components:   Glucose, Bld 108 (*)    All other components within normal limits  CBC - Abnormal; Notable for the following components:   Hemoglobin 11.1 (*)     HCT 35.5 (*)    All other components within normal limits  URINALYSIS, ROUTINE W REFLEX MICROSCOPIC - Abnormal; Notable for the following components:   Leukocytes,Ua MODERATE (*)    All other components within normal limits  URINE CULTURE  LIPASE, BLOOD  I-STAT BETA HCG BLOOD, ED ([facility], [facility], [facility] ONLY)  GC/CHLAMYDIA PROBE AMP ([place]) NOT AT [facility]   Hemoglobin  Date Value  Ref Range Status  09/01/2019 11.1 (L) 12.0 - 15.0 g/dL Final  12/45/8099 7.1 (L) 12.0 - 15.0 g/dL Final    Comment:    Reticulocyte Hemoglobin testing may be clinically indicated, consider ordering this additional test [id number]   01/14/2012 7.9 (L) 12.0 - 15.0 g/dL Final  39/76/7341 9.3 (L) 12.0 - 15.0 g/dL Final    EKG None  Radiology No results found.  Procedures Pelvic exam  Date/Time: 09/02/2019 8:20 AM Performed by: Anselm Pancoast, PA-C Authorized by: Anselm Pancoast, PA-C  Consent: Verbal consent obtained. Risks and benefits: risks, benefits and alternatives were discussed Consent given by: patient Patient understanding: patient states understanding of the procedure being performed Patient identity confirmed: verbally with patient and provided demographic data Local anesthesia used: no  Anesthesia: Local anesthesia used: no  Sedation: Patient sedated: no  Patient tolerance: patient tolerated the procedure well with no immediate complications    (including critical care time)  Medications Ordered in ED Medications - No data to display  ED Course  I have reviewed the triage vital signs and the nursing notes.  Pertinent labs & imaging results that were available during my care of the patient were reviewed by me and considered in my medical decision making (see chart for details).    MDM Rules/Calculators/A&P                          Patient presents with suprapubic pain beginning last night. This had largely resolved by the time I evaluated the patient. Abdominal exam benign. I  personally reviewed and interpreted the patient's lab results. Leukocytes on UA.  WBCs on wet prep.  Nonspecific findings.  Lab work otherwise unremarkable. I discussed options for how to proceed with her evaluation and care.  This included discussion of CT or ultrasound versus follow-up outpatient.  I discussed risks and benefits of these options.  Patient opted for outpatient follow-up stating she felt much better. She was advised to follow-up with at least OB/GYN, especially in light of the small areas of abnormalities on her cervix.  The patient was given instructions for home care as well as return precautions. Patient voices understanding of these instructions, accepts the plan, and is comfortable with discharge.   Vitals:   09/01/19 2235 09/01/19 2241 09/02/19 0749  BP: 133/71  112/71  Pulse: 82  (!) 55  Resp:  19 18  Temp: 98 F (36.7 C)  97.8 F (36.6 C)  TempSrc: Oral  Oral  SpO2: 98%  100%  Weight: 67.6 kg    Height: 5\' 2"  (1.575 m)      Final Clinical Impression(s) / ED Diagnoses Final diagnoses:  Suprapubic pain    Rx / DC Orders ED Discharge Orders    None       09/02/19 11/02/19    [id number], MD 09/06/19 [phone]

## 2019-09-02 NOTE — Discharge Instructions (Addendum)
  Antiinflammatory medications: Take 600 mg of ibuprofen every 6 hours or 440 mg (over the counter dose) to 500 mg (prescription dose) of naproxen every 12 hours for the next 3 days. After this time, these medications may be used as needed for pain. Take these medications with food to avoid upset stomach. Choose only one of these medications, do not take them together. Acetaminophen (generic for Tylenol): Should you continue to have additional pain while taking the ibuprofen or naproxen, you may add in acetaminophen as needed. Your daily total maximum amount of acetaminophen from all sources should be limited to 4000mg /day for persons without liver problems, or 2000mg /day for those with liver problems.  Follow-up: Recommend follow-up with OB/GYN on this matter.  Call to make an appointment. There were abnormalities noted to the cervix that should be reevaluated by OB/GYN.  Return: Return to the emergency department for worsened pain, uncontrolled bleeding, uncontrolled vomiting, or any other major concerns.

## 2019-09-03 LAB — GC/CHLAMYDIA PROBE AMP (~~LOC~~) NOT AT ARMC
Chlamydia: NEGATIVE
Comment: NEGATIVE
Comment: NORMAL
Neisseria Gonorrhea: NEGATIVE

## 2019-09-03 LAB — URINE CULTURE: Culture: NO GROWTH
